# Patient Record
Sex: Female | Born: 1950 | Race: White | Hispanic: No | Marital: Married | Smoking: Never smoker
Health system: Southern US, Community
[De-identification: ages and names within clinical notes are randomized; demographics above are authoritative.]

## PROBLEM LIST (undated history)

## (undated) DIAGNOSIS — E559 Vitamin D deficiency, unspecified: Secondary | ICD-10-CM

## (undated) DIAGNOSIS — G2581 Restless legs syndrome: Secondary | ICD-10-CM

## (undated) DIAGNOSIS — G43909 Migraine, unspecified, not intractable, without status migrainosus: Secondary | ICD-10-CM

## (undated) DIAGNOSIS — I10 Essential (primary) hypertension: Secondary | ICD-10-CM

## (undated) DIAGNOSIS — M1990 Unspecified osteoarthritis, unspecified site: Secondary | ICD-10-CM

## (undated) DIAGNOSIS — D649 Anemia, unspecified: Secondary | ICD-10-CM

## (undated) DIAGNOSIS — K219 Gastro-esophageal reflux disease without esophagitis: Secondary | ICD-10-CM

## (undated) DIAGNOSIS — F419 Anxiety disorder, unspecified: Secondary | ICD-10-CM

## (undated) HISTORY — PX: APPENDECTOMY: SHX54

## (undated) HISTORY — PX: TUBAL LIGATION: SHX77

---

## 2004-10-05 ENCOUNTER — Ambulatory Visit: Payer: Self-pay | Admitting: Unknown Physician Specialty

## 2010-03-03 ENCOUNTER — Ambulatory Visit: Payer: Self-pay | Admitting: Specialist

## 2010-03-23 ENCOUNTER — Ambulatory Visit: Payer: Self-pay | Admitting: Anesthesiology

## 2010-04-30 ENCOUNTER — Ambulatory Visit: Payer: Self-pay | Admitting: Anesthesiology

## 2010-05-20 ENCOUNTER — Ambulatory Visit: Payer: Self-pay | Admitting: Anesthesiology

## 2010-06-25 ENCOUNTER — Ambulatory Visit: Payer: Self-pay | Admitting: Anesthesiology

## 2010-07-22 ENCOUNTER — Ambulatory Visit: Payer: Self-pay | Admitting: Anesthesiology

## 2010-08-19 ENCOUNTER — Ambulatory Visit: Payer: Self-pay | Admitting: Anesthesiology

## 2010-09-25 ENCOUNTER — Ambulatory Visit: Payer: Self-pay | Admitting: Anesthesiology

## 2010-11-02 ENCOUNTER — Ambulatory Visit: Payer: Self-pay | Admitting: Anesthesiology

## 2010-12-14 ENCOUNTER — Ambulatory Visit: Payer: Self-pay | Admitting: Anesthesiology

## 2011-02-09 ENCOUNTER — Ambulatory Visit: Payer: Self-pay | Admitting: Anesthesiology

## 2011-03-11 ENCOUNTER — Ambulatory Visit: Payer: Self-pay | Admitting: Anesthesiology

## 2012-07-27 ENCOUNTER — Ambulatory Visit: Payer: Self-pay | Admitting: General Practice

## 2013-01-11 ENCOUNTER — Other Ambulatory Visit: Payer: Self-pay | Admitting: Neurological Surgery

## 2013-02-06 ENCOUNTER — Encounter (HOSPITAL_COMMUNITY): Payer: Self-pay | Admitting: Respiratory Therapy

## 2013-02-13 ENCOUNTER — Encounter (HOSPITAL_COMMUNITY): Payer: Self-pay

## 2013-02-13 ENCOUNTER — Encounter (HOSPITAL_COMMUNITY)
Admission: RE | Admit: 2013-02-13 | Discharge: 2013-02-13 | Disposition: A | Discharge status: Home / Self Care | Payer: BC Managed Care – PPO | Source: Ambulatory Visit | Attending: Neurological Surgery | Admitting: Neurological Surgery

## 2013-02-13 ENCOUNTER — Encounter (HOSPITAL_COMMUNITY)
Admission: RE | Admit: 2013-02-13 | Discharge: 2013-02-13 | Disposition: A | Discharge status: Home / Self Care | Payer: BC Managed Care – PPO | Source: Ambulatory Visit | Attending: Anesthesiology | Admitting: Anesthesiology

## 2013-02-13 DIAGNOSIS — I10 Essential (primary) hypertension: Secondary | ICD-10-CM | POA: Insufficient documentation

## 2013-02-13 DIAGNOSIS — Z01818 Encounter for other preprocedural examination: Secondary | ICD-10-CM | POA: Insufficient documentation

## 2013-02-13 DIAGNOSIS — Z01812 Encounter for preprocedural laboratory examination: Secondary | ICD-10-CM | POA: Insufficient documentation

## 2013-02-13 DIAGNOSIS — Z01810 Encounter for preprocedural cardiovascular examination: Secondary | ICD-10-CM | POA: Insufficient documentation

## 2013-02-13 DIAGNOSIS — E119 Type 2 diabetes mellitus without complications: Secondary | ICD-10-CM | POA: Insufficient documentation

## 2013-02-13 HISTORY — DX: Vitamin D deficiency, unspecified: E55.9

## 2013-02-13 HISTORY — DX: Restless legs syndrome: G25.81

## 2013-02-13 HISTORY — DX: Unspecified osteoarthritis, unspecified site: M19.90

## 2013-02-13 HISTORY — DX: Gastro-esophageal reflux disease without esophagitis: K21.9

## 2013-02-13 HISTORY — DX: Migraine, unspecified, not intractable, without status migrainosus: G43.909

## 2013-02-13 HISTORY — DX: Essential (primary) hypertension: I10

## 2013-02-13 HISTORY — DX: Anxiety disorder, unspecified: F41.9

## 2013-02-13 LAB — CBC
MCH: 29.2 pg (ref 26.0–34.0)
Platelets: 211 10*3/uL (ref 150–400)
RBC: 4.62 MIL/uL (ref 3.87–5.11)
RDW: 13.8 % (ref 11.5–15.5)
WBC: 7.3 10*3/uL (ref 4.0–10.5)

## 2013-02-13 LAB — BASIC METABOLIC PANEL
Calcium: 9 mg/dL (ref 8.4–10.5)
Creatinine, Ser: 0.69 mg/dL (ref 0.50–1.10)
GFR calc non Af Amer: >90 mL/min (ref >90)
Glucose, Bld: 120 mg/dL — ABNORMAL HIGH (ref 70–99)
Sodium: 140 mEq/L (ref 135–145)

## 2013-02-13 LAB — SURGICAL PCR SCREEN: Staphylococcus aureus: POSITIVE — AB

## 2013-02-13 LAB — ABO/RH: ABO/RH(D): O POS

## 2013-02-13 LAB — TYPE AND SCREEN: ABO/RH(D): O POS

## 2013-02-13 NOTE — Progress Notes (Signed)
Requested EKG from [facility]. Patient reports that is where she had her last EKG

## 2013-02-13 NOTE — Progress Notes (Signed)
Revonda Standard, [place] notified of Critical reading on EKG

## 2013-02-13 NOTE — Progress Notes (Signed)
Anesthesia PAT Evaluation: Patient is a 62 year old female scheduled for L4-5 decompression, PLIF by Dr. Danielle Dess on 02/20/2013. History includes migraines, hypertension, restless leg syndrome, vitamin D deficiency, GERD, anxiety, nonsmoker.  Her PCP is Dr. Julien Girt at [facility].   Unconfirmed computerized EKG interpretation from today 02/13/13 showed NSR, possible LAE, non-specific T wave abnormality with critical test result of long QT/QTc of 546/572 ms.  Her T waves are relatively flat, but my own QT measurement was 440 ms.  Since it was critical report, I also had cardiologist Dr. Arvilla Meres review her EKG and history just after 1500.  He also did not agree with the computer's interpretation of critical prolonged QT.  He measured her QT interval at 400 ms. In the absence of other findings, he did not feel her EKG was worrisome.      She does not see a cardiologist, but was evaluated at [facility] > 10 years ago.  She denies chest pain, SOB, edema.  She has no known history of CAD or arrhythmias and no known family history of arrhythmias or sudden death.   Exam shows a pleasant, Caucasian female in NAD.  Heart RRR, I/VI SEM, lungs clear, no carotid bruit or LE edema noted. Her activity has been limited for the past two years due to back pain.  She is able to vacuum and go up stairs, but she does so with back pain.  She is on beta blocker therapy for HTN.  CXR on 02/13/13 showed no acute cardiopulmonary disease.  Preoperative labs noted.  She will be evaluated by her assigned anesthesiologist on the day of surgery, but if no acute changes then would anticipate she could proceed as planned.  Velna Ochs [facility]/Anesthesiology Phone [phone] 02/13/2013 5:00 PM

## 2013-02-13 NOTE — Pre-Procedure Instructions (Signed)
Amanda Brooks  02/13/2013   Your procedure is scheduled on:  April 8  Report to [facility] at 05:30 AM.  Call this number if you have problems the morning of surgery: [phone]   Remember:   Do not eat food or drink liquids after midnight.   Take these medicines the morning of surgery with A SIP OF WATER: Celexa, Omeprazole, Propranolol,    STOP Meloxicam and Vitamin D after today  Do not wear jewelry, make-up or nail polish.  Do not wear lotions, powders, or perfumes. You may wear deodorant.  Do not shave 48 hours prior to surgery. Men may shave face and neck.  Do not bring valuables to the hospital.  Contacts, dentures or bridgework may not be worn into surgery.  Leave suitcase in the car. After surgery it may be brought to your room.  For patients admitted to the hospital, checkout time is 11:00 AM the day of  discharge.   Special Instructions: Shower using CHG 2 nights before surgery and the night before surgery.  If you shower the day of surgery use CHG.  Use special wash - you have one bottle of CHG for all showers.  You should use approximately 1/3 of the bottle for each shower.   Please read over the following fact sheets that you were given: Pain Booklet, Coughing and Deep Breathing, Blood Transfusion Information and Surgical Site Infection Prevention

## 2013-02-22 MED ORDER — CEFAZOLIN SODIUM-DEXTROSE 2-3 GM-% IV SOLR
2.0000 g | INTRAVENOUS | Status: AC
  Administered 2013-02-23: 2 g via INTRAVENOUS
  Filled 2013-02-22: qty 50

## 2013-02-23 ENCOUNTER — Ambulatory Visit (HOSPITAL_COMMUNITY): Payer: BC Managed Care – PPO

## 2013-02-23 ENCOUNTER — Encounter (HOSPITAL_COMMUNITY): Payer: Self-pay | Admitting: Anesthesiology

## 2013-02-23 ENCOUNTER — Encounter (HOSPITAL_COMMUNITY): Payer: Self-pay | Admitting: Vascular Surgery

## 2013-02-23 ENCOUNTER — Encounter (HOSPITAL_COMMUNITY)
Admission: RE | Disposition: A | Discharge status: Home / Self Care | Payer: Self-pay | Source: Ambulatory Visit | Attending: Neurological Surgery

## 2013-02-23 ENCOUNTER — Ambulatory Visit (HOSPITAL_COMMUNITY): Payer: BC Managed Care – PPO | Admitting: Anesthesiology

## 2013-02-23 ENCOUNTER — Inpatient Hospital Stay (HOSPITAL_COMMUNITY)
Admission: RE | Admit: 2013-02-23 | Discharge: 2013-02-26 | DRG: 756 | Disposition: A | Discharge status: Home / Self Care | Payer: BC Managed Care – PPO | Source: Ambulatory Visit | Attending: Neurological Surgery | Admitting: Neurological Surgery

## 2013-02-23 DIAGNOSIS — Z01810 Encounter for preprocedural cardiovascular examination: Secondary | ICD-10-CM

## 2013-02-23 DIAGNOSIS — M48061 Spinal stenosis, lumbar region without neurogenic claudication: Principal | ICD-10-CM | POA: Diagnosis present

## 2013-02-23 DIAGNOSIS — G2581 Restless legs syndrome: Secondary | ICD-10-CM | POA: Diagnosis present

## 2013-02-23 DIAGNOSIS — Z79899 Other long term (current) drug therapy: Secondary | ICD-10-CM

## 2013-02-23 DIAGNOSIS — K219 Gastro-esophageal reflux disease without esophagitis: Secondary | ICD-10-CM | POA: Diagnosis present

## 2013-02-23 DIAGNOSIS — F411 Generalized anxiety disorder: Secondary | ICD-10-CM | POA: Diagnosis present

## 2013-02-23 DIAGNOSIS — Q762 Congenital spondylolisthesis: Secondary | ICD-10-CM

## 2013-02-23 DIAGNOSIS — Z01812 Encounter for preprocedural laboratory examination: Secondary | ICD-10-CM

## 2013-02-23 DIAGNOSIS — M4316 Spondylolisthesis, lumbar region: Secondary | ICD-10-CM

## 2013-02-23 DIAGNOSIS — Z01818 Encounter for other preprocedural examination: Secondary | ICD-10-CM

## 2013-02-23 DIAGNOSIS — I10 Essential (primary) hypertension: Secondary | ICD-10-CM | POA: Diagnosis present

## 2013-02-23 SURGERY — POSTERIOR LUMBAR FUSION 1 LEVEL
Anesthesia: General | Site: Back | Wound class: Clean

## 2013-02-23 MED ORDER — ONDANSETRON HCL 4 MG/2ML IJ SOLN: 4.0000 mg | INTRAMUSCULAR | Status: DC | PRN

## 2013-02-23 MED ORDER — 0.9 % SODIUM CHLORIDE (POUR BTL) OPTIME
TOPICAL | Status: DC | PRN
  Administered 2013-02-23: 1000 mL

## 2013-02-23 MED ORDER — GLYCOPYRROLATE 0.2 MG/ML IJ SOLN
INTRAMUSCULAR | Status: DC | PRN
  Administered 2013-02-23: 0.6 mg via INTRAVENOUS

## 2013-02-23 MED ORDER — KETOROLAC TROMETHAMINE 30 MG/ML IJ SOLN
INTRAMUSCULAR | Status: AC
  Administered 2013-02-23: 15 mg
  Filled 2013-02-23: qty 1

## 2013-02-23 MED ORDER — METHOCARBAMOL 100 MG/ML IJ SOLN
500.0000 mg | Freq: Four times a day (QID) | INTRAVENOUS | Status: DC | PRN
  Filled 2013-02-23: qty 5

## 2013-02-23 MED ORDER — VITAMIN D3 25 MCG (1000 UNIT) PO TABS
2000.0000 [IU] | ORAL_TABLET | Freq: Every day | ORAL | Status: DC
  Administered 2013-02-23 – 2013-02-26 (×4): 2000 [IU] via ORAL
  Filled 2013-02-23 (×5): qty 2

## 2013-02-23 MED ORDER — SODIUM CHLORIDE 0.9 % IR SOLN
Status: DC | PRN
  Administered 2013-02-23: 14:00:00

## 2013-02-23 MED ORDER — SODIUM CHLORIDE 0.9 % IV SOLN: 250.0000 mL | INTRAVENOUS | Status: DC

## 2013-02-23 MED ORDER — LACTATED RINGERS IV SOLN
INTRAVENOUS | Status: DC | PRN
  Administered 2013-02-23 (×2): via INTRAVENOUS

## 2013-02-23 MED ORDER — ARTIFICIAL TEARS OP OINT
TOPICAL_OINTMENT | OPHTHALMIC | Status: DC | PRN
  Administered 2013-02-23: 1 via OPHTHALMIC

## 2013-02-23 MED ORDER — METOCLOPRAMIDE HCL 5 MG/ML IJ SOLN
INTRAMUSCULAR | Status: DC | PRN
  Administered 2013-02-23: 10 mg via INTRAVENOUS

## 2013-02-23 MED ORDER — ACETAMINOPHEN 325 MG PO TABS
650.0000 mg | ORAL_TABLET | ORAL | Status: DC | PRN
  Administered 2013-02-24 (×2): 650 mg via ORAL
  Filled 2013-02-23 (×2): qty 2

## 2013-02-23 MED ORDER — MENTHOL 3 MG MT LOZG: 1.0000 | LOZENGE | OROMUCOSAL | Status: DC | PRN

## 2013-02-23 MED ORDER — METHOCARBAMOL 500 MG PO TABS
500.0000 mg | ORAL_TABLET | Freq: Four times a day (QID) | ORAL | Status: DC | PRN
  Administered 2013-02-24 – 2013-02-26 (×6): 500 mg via ORAL
  Filled 2013-02-23 (×6): qty 1

## 2013-02-23 MED ORDER — OXYCODONE-ACETAMINOPHEN 5-325 MG PO TABS
1.0000 | ORAL_TABLET | ORAL | Status: DC | PRN
  Filled 2013-02-23: qty 2

## 2013-02-23 MED ORDER — LIDOCAINE-EPINEPHRINE 1 %-1:100000 IJ SOLN
INTRAMUSCULAR | Status: DC | PRN
  Administered 2013-02-23: 20 mL

## 2013-02-23 MED ORDER — ONDANSETRON HCL 4 MG/2ML IJ SOLN
INTRAMUSCULAR | Status: DC | PRN
  Administered 2013-02-23: 4 mg via INTRAVENOUS

## 2013-02-23 MED ORDER — PHENOL 1.4 % MT LIQD: 1.0000 | OROMUCOSAL | Status: DC | PRN

## 2013-02-23 MED ORDER — NEOSTIGMINE METHYLSULFATE 1 MG/ML IJ SOLN
INTRAMUSCULAR | Status: DC | PRN
  Administered 2013-02-23: 4 mg via INTRAVENOUS

## 2013-02-23 MED ORDER — SODIUM CHLORIDE 0.9 % IV SOLN
INTRAVENOUS | Status: AC
  Filled 2013-02-23: qty 500

## 2013-02-23 MED ORDER — SODIUM CHLORIDE 0.9 % IJ SOLN
3.0000 mL | Freq: Two times a day (BID) | INTRAMUSCULAR | Status: DC
  Administered 2013-02-24 – 2013-02-26 (×4): 3 mL via INTRAVENOUS

## 2013-02-23 MED ORDER — ONDANSETRON HCL 4 MG/2ML IJ SOLN: 4.0000 mg | Freq: Once | INTRAMUSCULAR | Status: DC | PRN

## 2013-02-23 MED ORDER — PROPOFOL 10 MG/ML IV BOLUS
INTRAVENOUS | Status: DC | PRN
  Administered 2013-02-23: 200 mg via INTRAVENOUS

## 2013-02-23 MED ORDER — MORPHINE SULFATE 2 MG/ML IJ SOLN
1.0000 mg | INTRAMUSCULAR | Status: DC | PRN
  Administered 2013-02-23: 2 mg via INTRAVENOUS
  Administered 2013-02-24 (×2): 4 mg via INTRAVENOUS
  Administered 2013-02-24: 2 mg via INTRAVENOUS
  Administered 2013-02-24: 4 mg via INTRAVENOUS
  Administered 2013-02-25: 2 mg via INTRAVENOUS
  Filled 2013-02-23: qty 2
  Filled 2013-02-23 (×3): qty 1
  Filled 2013-02-23 (×3): qty 2

## 2013-02-23 MED ORDER — FLEET ENEMA 7-19 GM/118ML RE ENEM: 1.0000 | ENEMA | Freq: Once | RECTAL | Status: AC | PRN

## 2013-02-23 MED ORDER — CEFAZOLIN SODIUM 1-5 GM-% IV SOLN
1.0000 g | Freq: Three times a day (TID) | INTRAVENOUS | Status: AC
  Administered 2013-02-23 – 2013-02-24 (×2): 1 g via INTRAVENOUS
  Filled 2013-02-23 (×2): qty 50

## 2013-02-23 MED ORDER — BISACODYL 10 MG RE SUPP: 10.0000 mg | Freq: Every day | RECTAL | Status: DC | PRN

## 2013-02-23 MED ORDER — ROCURONIUM BROMIDE 100 MG/10ML IV SOLN: INTRAVENOUS | Status: DC | PRN

## 2013-02-23 MED ORDER — BUPIVACAINE HCL (PF) 0.5 % IJ SOLN
INTRAMUSCULAR | Status: DC | PRN
  Administered 2013-02-23: 30 mL

## 2013-02-23 MED ORDER — HYDROMORPHONE HCL PF 1 MG/ML IJ SOLN
0.2500 mg | INTRAMUSCULAR | Status: DC | PRN
  Administered 2013-02-23 (×2): 0.5 mg via INTRAVENOUS

## 2013-02-23 MED ORDER — PROPRANOLOL HCL ER 160 MG PO CP24
160.0000 mg | ORAL_CAPSULE | Freq: Every day | ORAL | Status: DC
  Administered 2013-02-26: 160 mg via ORAL
  Filled 2013-02-23 (×3): qty 1

## 2013-02-23 MED ORDER — DOCUSATE SODIUM 100 MG PO CAPS
100.0000 mg | ORAL_CAPSULE | Freq: Two times a day (BID) | ORAL | Status: DC
  Administered 2013-02-23 – 2013-02-26 (×6): 100 mg via ORAL
  Filled 2013-02-23 (×6): qty 1

## 2013-02-23 MED ORDER — SODIUM CHLORIDE 0.9 % IJ SOLN: 3.0000 mL | INTRAMUSCULAR | Status: DC | PRN

## 2013-02-23 MED ORDER — DEXTROSE 5 % IV SOLN
INTRAVENOUS | Status: DC | PRN
  Administered 2013-02-23: 14:00:00 via INTRAVENOUS

## 2013-02-23 MED ORDER — POLYETHYLENE GLYCOL 3350 17 G PO PACK
17.0000 g | PACK | Freq: Every day | ORAL | Status: DC | PRN
  Filled 2013-02-23: qty 1

## 2013-02-23 MED ORDER — EPHEDRINE SULFATE 50 MG/ML IJ SOLN
INTRAMUSCULAR | Status: DC | PRN
  Administered 2013-02-23: 10 mg via INTRAVENOUS

## 2013-02-23 MED ORDER — ROCURONIUM BROMIDE 100 MG/10ML IV SOLN
INTRAVENOUS | Status: DC | PRN
  Administered 2013-02-23: 35 mg via INTRAVENOUS

## 2013-02-23 MED ORDER — KETOROLAC TROMETHAMINE 15 MG/ML IJ SOLN
15.0000 mg | Freq: Three times a day (TID) | INTRAMUSCULAR | Status: AC
  Administered 2013-02-23 – 2013-02-25 (×5): 15 mg via INTRAVENOUS
  Filled 2013-02-23 (×5): qty 1

## 2013-02-23 MED ORDER — ALUM & MAG HYDROXIDE-SIMETH 200-200-20 MG/5ML PO SUSP: 30.0000 mL | Freq: Four times a day (QID) | ORAL | Status: DC | PRN

## 2013-02-23 MED ORDER — SUMATRIPTAN SUCCINATE 100 MG PO TABS
100.0000 mg | ORAL_TABLET | ORAL | Status: DC | PRN
  Filled 2013-02-23: qty 1

## 2013-02-23 MED ORDER — VITAMIN D3 50 MCG (2000 UT) PO TABS
2000.0000 [IU] | ORAL_TABLET | Freq: Every day | ORAL | Status: DC
  Filled 2013-02-23: qty 1

## 2013-02-23 MED ORDER — ROPINIROLE HCL 1 MG PO TABS
2.0000 mg | ORAL_TABLET | Freq: Every day | ORAL | Status: DC
  Administered 2013-02-23 – 2013-02-25 (×3): 2 mg via ORAL
  Filled 2013-02-23 (×5): qty 2

## 2013-02-23 MED ORDER — SODIUM CHLORIDE 0.9 % IV SOLN
INTRAVENOUS | Status: DC
  Administered 2013-02-23 – 2013-02-24 (×3): via INTRAVENOUS

## 2013-02-23 MED ORDER — BACITRACIN 50000 UNITS IM SOLR
INTRAMUSCULAR | Status: AC
  Filled 2013-02-23: qty 1

## 2013-02-23 MED ORDER — FENTANYL CITRATE 0.05 MG/ML IJ SOLN
INTRAMUSCULAR | Status: DC | PRN
  Administered 2013-02-23: 150 ug via INTRAVENOUS
  Administered 2013-02-23 (×2): 50 ug via INTRAVENOUS
  Administered 2013-02-23: 100 ug via INTRAVENOUS
  Administered 2013-02-23 (×3): 50 ug via INTRAVENOUS

## 2013-02-23 MED ORDER — ACETAMINOPHEN 650 MG RE SUPP: 650.0000 mg | RECTAL | Status: DC | PRN

## 2013-02-23 MED ORDER — HYDROMORPHONE HCL PF 1 MG/ML IJ SOLN
INTRAMUSCULAR | Status: AC
  Filled 2013-02-23: qty 1

## 2013-02-23 MED ORDER — LIDOCAINE HCL (CARDIAC) 20 MG/ML IV SOLN
INTRAVENOUS | Status: DC | PRN
  Administered 2013-02-23: 50 mg via INTRAVENOUS

## 2013-02-23 MED ORDER — CITALOPRAM HYDROBROMIDE 40 MG PO TABS
40.0000 mg | ORAL_TABLET | Freq: Every day | ORAL | Status: DC
  Administered 2013-02-24 – 2013-02-26 (×3): 40 mg via ORAL
  Filled 2013-02-23 (×3): qty 1

## 2013-02-23 MED ORDER — SENNA 8.6 MG PO TABS
1.0000 | ORAL_TABLET | Freq: Two times a day (BID) | ORAL | Status: DC
  Administered 2013-02-23 – 2013-02-26 (×6): 8.6 mg via ORAL
  Filled 2013-02-23 (×7): qty 1

## 2013-02-23 MED ORDER — PANTOPRAZOLE SODIUM 40 MG PO TBEC
40.0000 mg | DELAYED_RELEASE_TABLET | Freq: Every day | ORAL | Status: DC
  Administered 2013-02-24 – 2013-02-26 (×3): 40 mg via ORAL
  Filled 2013-02-23 (×2): qty 1

## 2013-02-23 MED ORDER — THROMBIN 20000 UNITS EX KIT
PACK | CUTANEOUS | Status: DC | PRN
  Administered 2013-02-23: 20000 [IU] via TOPICAL

## 2013-02-23 MED ORDER — ALBUMIN HUMAN 5 % IV SOLN
INTRAVENOUS | Status: DC | PRN
  Administered 2013-02-23: 15:00:00 via INTRAVENOUS

## 2013-02-23 MED ORDER — PHENYLEPHRINE HCL 10 MG/ML IJ SOLN
INTRAMUSCULAR | Status: DC | PRN
  Administered 2013-02-23: 40 ug via INTRAVENOUS
  Administered 2013-02-23: 80 ug via INTRAVENOUS

## 2013-02-23 MED ORDER — HEMOSTATIC AGENTS (NO CHARGE) OPTIME
TOPICAL | Status: DC | PRN
  Administered 2013-02-23: 1 via TOPICAL

## 2013-02-23 SURGICAL SUPPLY — 61 items
BAG DECANTER FOR FLEXI CONT (MISCELLANEOUS) ×2 IMPLANT
BLADE SURG ROTATE 9660 (MISCELLANEOUS) IMPLANT
BUR MATCHSTICK NEURO 3.0 LAGG (BURR) ×2 IMPLANT
CANISTER SUCTION 2500CC (MISCELLANEOUS) ×2 IMPLANT
CLOTH BEACON ORANGE TIMEOUT ST (SAFETY) ×2 IMPLANT
CONT SPEC 4OZ CLIKSEAL STRL BL (MISCELLANEOUS) ×4 IMPLANT
COVER BACK TABLE 24X17X13 BIG (DRAPES) IMPLANT
COVER TABLE BACK 60X90 (DRAPES) ×2 IMPLANT
DECANTER SPIKE VIAL GLASS SM (MISCELLANEOUS) ×2 IMPLANT
DERMABOND ADHESIVE PROPEN (GAUZE/BANDAGES/DRESSINGS) ×1
DERMABOND ADVANCED (GAUZE/BANDAGES/DRESSINGS) ×1
DERMABOND ADVANCED .7 DNX12 (GAUZE/BANDAGES/DRESSINGS) ×1 IMPLANT
DERMABOND ADVANCED .7 DNX6 (GAUZE/BANDAGES/DRESSINGS) ×1 IMPLANT
DRAPE C-ARM 42X72 X-RAY (DRAPES) ×4 IMPLANT
DRAPE LAPAROTOMY 100X72X124 (DRAPES) ×2 IMPLANT
DRAPE POUCH INSTRU U-SHP 10X18 (DRAPES) ×2 IMPLANT
DRAPE PROXIMA HALF (DRAPES) IMPLANT
DURAPREP 26ML APPLICATOR (WOUND CARE) ×2 IMPLANT
ELECT REM PT RETURN 9FT ADLT (ELECTROSURGICAL) ×2
ELECTRODE REM PT RTRN 9FT ADLT (ELECTROSURGICAL) ×1 IMPLANT
GAUZE SPONGE 4X4 16PLY XRAY LF (GAUZE/BANDAGES/DRESSINGS) IMPLANT
GLOVE BIOGEL PI IND STRL 7.0 (GLOVE) ×3 IMPLANT
GLOVE BIOGEL PI IND STRL 8.5 (GLOVE) ×2 IMPLANT
GLOVE BIOGEL PI INDICATOR 7.0 (GLOVE) ×3
GLOVE BIOGEL PI INDICATOR 8.5 (GLOVE) ×2
GLOVE ECLIPSE 8.5 STRL (GLOVE) ×4 IMPLANT
GLOVE EXAM NITRILE LRG STRL (GLOVE) IMPLANT
GLOVE EXAM NITRILE MD LF STRL (GLOVE) IMPLANT
GLOVE EXAM NITRILE XL STR (GLOVE) ×2 IMPLANT
GLOVE EXAM NITRILE XS STR PU (GLOVE) IMPLANT
GLOVE INDICATOR 7.5 STRL GRN (GLOVE) ×2 IMPLANT
GOWN BRE IMP SLV AUR LG STRL (GOWN DISPOSABLE) IMPLANT
GOWN BRE IMP SLV AUR XL STRL (GOWN DISPOSABLE) IMPLANT
GOWN STRL REIN 2XL LVL4 (GOWN DISPOSABLE) ×4 IMPLANT
KIT BASIN OR (CUSTOM PROCEDURE TRAY) ×2 IMPLANT
KIT ROOM TURNOVER OR (KITS) ×2 IMPLANT
MILL MEDIUM DISP (BLADE) ×2 IMPLANT
NEEDLE HYPO 22GX1.5 SAFETY (NEEDLE) ×2 IMPLANT
NS IRRIG 1000ML POUR BTL (IV SOLUTION) ×2 IMPLANT
PACK FOAM VITOSS 10CC (Orthopedic Implant) ×2 IMPLANT
PACK LAMINECTOMY NEURO (CUSTOM PROCEDURE TRAY) ×2 IMPLANT
PAD ARMBOARD 7.5X6 YLW CONV (MISCELLANEOUS) ×6 IMPLANT
PATTIES SURGICAL .5 X1 (DISPOSABLE) ×2 IMPLANT
PEEK PLIF NOVEL 9X25X12 (Peek) ×4 IMPLANT
ROD 45.5X40CM (Rod) ×4 IMPLANT
SCREW 40MM (Screw) ×8 IMPLANT
SCREW SET SPINAL STD HEXALOBE (Screw) ×8 IMPLANT
SPONGE GAUZE 4X4 12PLY (GAUZE/BANDAGES/DRESSINGS) ×2 IMPLANT
SPONGE LAP 4X18 X RAY DECT (DISPOSABLE) IMPLANT
SPONGE SURGIFOAM ABS GEL 100 (HEMOSTASIS) ×2 IMPLANT
SUT VIC AB 1 CT1 18XBRD ANBCTR (SUTURE) ×1 IMPLANT
SUT VIC AB 1 CT1 8-18 (SUTURE) ×1
SUT VIC AB 2-0 CP2 18 (SUTURE) ×2 IMPLANT
SUT VIC AB 3-0 SH 8-18 (SUTURE) ×2 IMPLANT
SYR 20ML ECCENTRIC (SYRINGE) ×2 IMPLANT
TAPE CLOTH SURG 4X10 WHT LF (GAUZE/BANDAGES/DRESSINGS) ×2 IMPLANT
TOWEL OR 17X24 6PK STRL BLUE (TOWEL DISPOSABLE) ×2 IMPLANT
TOWEL OR 17X26 10 PK STRL BLUE (TOWEL DISPOSABLE) ×2 IMPLANT
TRAP SPECIMEN MUCOUS 40CC (MISCELLANEOUS) ×2 IMPLANT
TRAY FOLEY CATH 14FRSI W/METER (CATHETERS) ×2 IMPLANT
WATER STERILE IRR 1000ML POUR (IV SOLUTION) ×2 IMPLANT

## 2013-02-23 NOTE — H&P (Signed)
CHIEF COMPLAINT:    Back and leg pain.  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 She tells me that for over two year's time she has had progressively worsening back and leg pain. She describes the worse pain in her buttock and hip on the right side with radiation down into the lower extremities.  She finds that progressively her ability to walk and the strength in her legs has been deteriorating.  She tells me now that she has a hard time walking onto her toes at all and she feels that her legs are weakening.  She has been seen and treated initially by her primary care physician and she encloses a time line starting around August of 2010 when she saw her family doctor who felt her problem was musculature and sent her for four months of physical therapy which did not yield any significant relief.  In March of 2011, she found Dr. Reita Chard, an orthopedist in [place] who did x-rays of the hip and wanted to do an MRI but suggested that she be treated at the pain clinic.  She subsequently saw Dr. Henrene Hawking in May of 2011 through April 2012 and had a series of three epidural steroid injections followed by three facet blocks followed by radiofrequency procedure followed by further epidural steroid injections which gave limited if any relief of duration.  Then in September 2012 she started seeing Dr. Renea Ee, chiropractor through November of 2012.  In April of this past year, 2013, she saw Dr. Artis Flock, an orthopedist at [facility] and tried some steroids and strengthening exercises with limited relief. In September 2013, an MRI was performed and then she was seen by Dr. Yves Dill a physiatrist at [facility].  Several medications were tried and she has since that time been referred for this evaluation.    PAST MEDICAL HISTORY:   She has had hypertension.    CURRENT MEDICATIONS:   Propranolol 160  mg. q.d. for blood pressure, Meloxicam 15 mg. q.d. for arthritis, Citalopram 40 mg. q.d. for anxiety, Ropinirole 2 mg. q.d. for restless legs, Omeprazole 20 mg. q.d. for heartburn, Vitamin D1 and Sumatriptan 100 mg. as needed for migraines    ALLERGIES:     She notes no allergies to medications.   SOCIAL HISTORY:    She does not smoke or use alcohol.  Height and weight have been stable at 5'5" tall and 145 pounds.    FAMILY HISTORY:    Notable for some hypertension.  Parents are deceased. Mother of cancer and father of heart disease.    REVIEW OF SYSTEMS:   Notable for wearing of glasses, sinus problems, high blood pressure, leg pain while walking, leg weakness, back pain, leg pain, arthritis and neck pain and anxiety as noted on the 14-point review sheet in the office today.   PHYSICAL EXAMINATION:   On physical exam, I note she stands straight and erect without difficulty. She is able to flex forward to +80 degrees easily touching her fingertips to the floor. She hyperextends with difficulty and notes that this exacerbates things.  Palpation and percussion across her lumbar spine does not reproduce any symptoms.  On testing her heel and toe walking, she does have trouble balancing onto her toes in either lower extremity and on heel walking there is weakness noted more of the extensor hallucis longus on the left side compared to the right side.  Her  reflexes are 1+ and symmetric in the patella and trace in the Achilles.     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  I demonstrated the findings to the patient and her daughter.  I indicated that overall her spine appears quite healthy in every other way save for the lesion at L4-5.  I noted that this lesion is actually remedial to surgery.  I described to her a decompression by removing the bone on the posterior arch, removing the disc completely at L4-5, placing spacers  into the disc space to realign the vertebra and then placing bone graft into the disc space and around it and placing screws into the vertebrae to hold the vertebra together to allow this to heal as a single unit.  This is indeed a substantial operation.  However the fact that she has single level disease with a high grade stenosis would indicate to me that she has the best chance of healing this process.  Overall I believe she should do well.  I indicated that the surgery takes about 3 hours itself to do.  Most individuals are in the hospital for about 3-4 days time.  An external corset is worn for about 6-8 weeks and then it takes an additional 6-8 weeks to heal fully to where a person can function completely independently.  Back pain to some degree is usually present to some degree for up to 6-8 months.  However, this should be in a diminishing fashion.    The patient has been through an extensive effort at conservative management and having failed this and having evolving weakness in her lower extremity, I think that ultimately she needs to have the surgery performed.  She is admitted for surgery today.

## 2013-02-23 NOTE — Op Note (Signed)
Procedure: L4-L5 decompressive laminectomy decompression of L4 and L5 nerve roots, posterior lumbar interbody arthrodesis with peek spacers local autograft and allograft, pedicle screw fixation L4-L5, posterior lateral arthrodesis L4-L5  Surgeon: Barnett Abu M.D.  Asst.: Hilda Lias  Indications: Patient is Amanda Brooks is a 62 y.o. female who who's had significant back pain and lumbar radiculopathy for over a years period time. A lumbar MRI demonstrates advanced spondylolisthesis with high-grade canal stenosis. she was advised regarding surgical intervention. There is stenosis also for the L4 and L5 nerve roots bilaterally  Procedure: The patient was brought to the operating room supine on a stretcher. After the smooth induction of general endotracheal anesthesia she was turned prone and the back was prepped with alcohol and DuraPrep. The back was then draped sterilely. A midline incision was created and carried down to the lumbar dorsal fascia. A localizing radiograph identified the L4 and L5 spinous processes. A subligamentous dissection was created at L4 and L5 to expose the interlaminar space at L4 and L5 and the facet joints over the L4-L5 interspace. Laminotomies were were then created removing the entire inferior margin of the lamina of L4 including the inferior facet at the L4-L5 joint. The yellow ligament was taken up and the common dural tube was exposed along with the L4 nerve root superiorly, and the L5 nerve root inferiorly, the disc space was exposed and epidural veins in this region were cauterized and divided. The L4 nerve roots and the L5 nerve root were dissected with care taken to protect them. There is severe stenosis for the L4 nerve roots caused by primarily hypertrophy of the superior articular process of L5. This was dissected and the nerve roots were freed. The L5 nerve roots were noted to be severely stenotic and there entry into the foramen for L5 inferiorly and this was  similarly decompressed using a high-speed drill and a combination of curettes and rongeurs. The disc space was opened and a combination of curettes and rongeurs was used to evacuate the disc space fully. The endplates were removed using sharp curettes. An interbody spacer was placed to distract the disc space while the contralateral discectomy was performed. When the entirety of the disc was removed and the endplates were prepared final sizing of the disc space was obtained 12 mm peek spacers were chosen and packed with autograft and allograft and placed into the interspace. The remainder of the interspace was packed with autograft and allograft. Pedicle entry sites were then chosen using fluoroscopic guidance and 6.5 x 40 mm screws were placed in L4 and 6.5 x 40 mm screws were placed in L5. The lateral gutters were decorticated and graft was packed in the posterolateral gutters between L4 and L5. Final radiographs were obtained after placing appropriately sized rods between the pedicle screws at L4-L5 and torquing these to the appropriate tension. The surgical site was inspected carefully to assure the L4 and L5 nerve roots were well decompressed, hemostasis was obtained, and the graft was well packed. Then the retractors were removed and the wound was closed with #1 Vicryl in the lumbar dorsal fascia 2-0 Vicryl in the subcutaneous tissue and 3-0 Vicryl subcuticularly. When the fascia was closed 20 cc of half percent Marcaine was injected into the paraspinous musculature at the time of closure. Blood loss was estimated at 150 cc. The patient tolerated procedure well and was returned to the recovery room in stable condition. Procedure and was done with Dr. Jeral Fruit who at times would dissect the  disc space provide retraction of the common dural tube and also provide exposure by suctioning in the epidural space as needed.

## 2013-02-23 NOTE — Anesthesia Preprocedure Evaluation (Addendum)
Anesthesia Evaluation  Patient identified by MRN, date of birth, ID band Patient awake    Reviewed: Allergy & Precautions, H&P , NPO status , Patient's Chart, lab work & pertinent test results, reviewed documented beta blocker date and time   Airway Mallampati: III TM Distance: >3 FB Neck ROM: full  Mouth opening: Limited Mouth Opening  Dental  (+) Teeth Intact and Dental Advisory Given   Pulmonary          Cardiovascular hypertension, Pt. on home beta blockers Rhythm:regular Rate:Normal     Neuro/Psych  Headaches, Anxiety    GI/Hepatic GERD-  ,  Endo/Other    Renal/GU      Musculoskeletal   Abdominal   Peds  Hematology   Anesthesia Other Findings   Reproductive/Obstetrics                         Anesthesia Physical Anesthesia Plan  ASA: II  Anesthesia Plan: General   Post-op Pain Management:    Induction: Intravenous  Airway Management Planned: Oral ETT  Additional Equipment:   Intra-op Plan:   Post-operative Plan: Extubation in OR  Informed Consent: I have reviewed the patients History and Physical, chart, labs and discussed the procedure including the risks, benefits and alternatives for the proposed anesthesia with the patient or authorized representative who has indicated his/her understanding and acceptance.     Plan Discussed with: CRNA, Anesthesiologist and Surgeon  Anesthesia Plan Comments:         Anesthesia Quick Evaluation

## 2013-02-23 NOTE — Preoperative (Signed)
Beta Blockers   Reason not to administer Beta Blockers:Inderal 0700 today

## 2013-02-23 NOTE — Anesthesia Procedure Notes (Signed)
Procedure Name: Intubation Date/Time: 02/23/2013 1:33 PM Performed by: Darcey Nora B Pre-anesthesia Checklist: Patient identified, Emergency Drugs available, Suction available and Patient being monitored Patient Re-evaluated:Patient Re-evaluated prior to inductionOxygen Delivery Method: Circle system utilized Preoxygenation: Pre-oxygenation with 100% oxygen Intubation Type: IV induction Ventilation: Mask ventilation without difficulty Laryngoscope Size: Mac and 3 Grade View: Grade I Tube type: Oral Tube size: 7.5 mm Number of attempts: 1 Airway Equipment and Method: Stylet Placement Confirmation: ETT inserted through vocal cords under direct vision,  breath sounds checked- equal and bilateral and positive ETCO2 Secured at: 21 (cm at teeth) cm Tube secured with: Tape Dental Injury: Teeth and Oropharynx as per pre-operative assessment

## 2013-02-23 NOTE — Transfer of Care (Signed)
Immediate Anesthesia Transfer of Care Note  Patient: Amanda Brooks  Procedure(s) Performed: Procedure(s) with comments: POSTERIOR LUMBAR FUSION 1 LEVEL (N/A) - Lumbar four - five  Decompression/posterior lumbar interbody fusion/Peek/Pedicle screws  Patient Location: PACU  Anesthesia Type:General  Level of Consciousness: oriented, sedated, patient cooperative and responds to stimulation  Airway & Oxygen Therapy: Patient Spontanous Breathing and Patient connected to nasal cannula oxygen  Post-op Assessment: Report given to PACU RN, Post -op Vital signs reviewed and stable, Patient moving all extremities and Patient moving all extremities X 4  Post vital signs: Reviewed and stable  Complications: No apparent anesthesia complications

## 2013-02-23 NOTE — Anesthesia Postprocedure Evaluation (Signed)
Anesthesia Post Note  Patient: Amanda Brooks  Procedure(s) Performed: Procedure(s) (LRB): POSTERIOR LUMBAR FUSION 1 LEVEL (N/A)  Anesthesia type: General  Patient location: PACU  Post pain: Pain level controlled  Post assessment: Patient's Cardiovascular Status Stable  Last Vitals:  Filed Vitals:   02/23/13 1715  BP:   Pulse: 58  Temp:   Resp: 12    Post vital signs: Reviewed and stable  Level of consciousness: alert  Complications: No apparent anesthesia complications

## 2013-02-24 MED ORDER — HYDROCODONE-ACETAMINOPHEN 5-325 MG PO TABS
1.0000 | ORAL_TABLET | ORAL | Status: DC | PRN
  Administered 2013-02-24: 1 via ORAL
  Administered 2013-02-24 – 2013-02-26 (×9): 2 via ORAL
  Filled 2013-02-24 (×3): qty 2
  Filled 2013-02-24: qty 1
  Filled 2013-02-24 (×6): qty 2

## 2013-02-24 NOTE — Progress Notes (Signed)
Subjective: Patient reports Back pain postop yesterday  Objective: Vital signs in last 24 hours: Temp:  [97 F (36.1 C)-100.7 F (38.2 C)] 100.7 F (38.2 C) (04/12 1023) Pulse Rate:  [58-93] 89 (04/12 1023) Resp:  [12-23] 20 (04/12 1023) BP: (91-136)/(53-71) 91/53 mmHg (04/12 1023) SpO2:  [91 %-100 %] 100 % (04/12 1023) FiO2 (%):  [2 %] 2 % (04/11 1756) Weight:  [68.04 kg (150 lb)] 68.04 kg (150 lb) (04/11 1804)  Intake/Output from previous day: 04/11 0701 - 04/12 0700 In: 2900 [I.V.:2650; IV Piggyback:250] Out: 2650 [Urine:2500; Blood:150] Intake/Output this shift:    Dressing is clean and dry motor function appears intact in both lower extremities.  Lab Results: No results found for this basename: WBC, HGB, HCT, PLT,  in the last 72 hours BMET No results found for this basename: NA, K, CL, CO2, GLUCOSE, BUN, CREATININE, CALCIUM,  in the last 72 hours  Studies/Results: Dg Lumbar Spine 2-3 Views  02/23/2013  *RADIOLOGY REPORT*  Clinical Data: L4-5 PLIF  LUMBAR SPINE - 2-3 VIEW  Comparison: Same day  Findings: Two C-arm images show discectomy and fusion at L4-5. Interbody fusion material appears well positioned.  There are bilateral pedicle screws and posterior rods.  IMPRESSION: Good appearance following L4-5 PLIF.   Original Report Authenticated By: Paulina Fusi, M.D.    Dg Lumbar Spine 1 View  02/23/2013  *RADIOLOGY REPORT*  Clinical Data: L4-5 fusion  LUMBAR SPINE - 1 VIEW  Comparison: None.  Findings: Surgical instruments project over the L4 and L5 spinous processes.  5 mm anterolisthesis L4 upon L5.  No compression deformity.  IMPRESSION: Intraoperative localization at L4-5.   Original Report Authenticated By: Jolaine Click, M.D.     Assessment/Plan: Stable postop day 1  LOS: 1 day  Mobilizing hep well IVs.   [name] 02/24/2013, 12:05 PM

## 2013-02-24 NOTE — Progress Notes (Signed)
Asked patient if she would get OOB with me and ambulate to the chair, patient said she was in too much pain but would try later after some pain medicine. Morphine given, will reattempt ambulation later on, and will remove foley at that time.  Minor, Yvette Rack

## 2013-02-24 NOTE — Progress Notes (Signed)
Pt with oral temp 100.7 per tech, tylenol given by RN, instructed about incentive spirometer, teaching completed. Pt states understanding to use IS x10 each hour that awake  Minor, Morrie Sheldon Horst Ostermiller

## 2013-02-24 NOTE — Progress Notes (Signed)
Occupational Therapy Evaluation Patient Details Name: Amanda Brooks MRN: [id number] DOB: 1951/06/25 Today's Date: 02/24/2013 Time: 1478-2956 OT Time Calculation (min): 14 min  OT Assessment / Plan / Recommendation Clinical Impression  62 yo s/p L4-5 PLIF. PTA, pt independent with all ADL and mobility for ADL. Pt will benefit from skilled OT services to facilitate D/C to home with 24/7 S of husband due to below deficits. Pt will benefit from education regarding AE for LB ADL/toileting and DME.    OT Assessment  Patient needs continued OT Services    Follow Up Recommendations  No OT follow up    Barriers to Discharge None    Equipment Recommendations  3 in 1 bedside comode    Recommendations for Other Services  none  Frequency  Min 2X/week    Precautions / Restrictions Precautions Precautions: Back Precaution Booklet Issued: Yes (comment) Precaution Comments: pt able to recall 3/3 precautions from earlier PT session Required Braces or Orthoses: Spinal Brace Spinal Brace: Applied in sitting position;Lumbar corset   Pertinent Vitals/Pain 4. Back. repositioned    ADL  Grooming: Supervision/safety Where Assessed - Grooming: Unsupported standing Upper Body Bathing: Set up;Supervision/safety Where Assessed - Upper Body Bathing: Unsupported sitting Lower Body Bathing: Moderate assistance Where Assessed - Lower Body Bathing: Supported sit to stand Upper Body Dressing: Supervision/safety;Set up Where Assessed - Upper Body Dressing: Unsupported sitting Lower Body Dressing: Moderate assistance Where Assessed - Lower Body Dressing: Supported sit to Pharmacist, hospital: Hydrographic surveyor Method: Sit to Barista: Comfort height toilet Toileting - Architect and Hygiene: Moderate assistance Where Assessed - Toileting Clothing Manipulation and Hygiene: Sit to stand from 3-in-1 or toilet Tub/Shower Transfer: Minimal assistance Tub/Shower  Transfer Method: Ambulating Transfers/Ambulation Related to ADLs: minguard ADL Comments: Began education on cross leg technique to adhere to back precautions during ADL. Rec pt purchase reacher and long handled sponge. discussed AE for hygiene after toileting.    OT Diagnosis: Generalized weakness;Acute pain  OT Problem List: Decreased strength;Decreased activity tolerance;Decreased knowledge of use of DME or AE;Decreased knowledge of precautions;Pain OT Treatment Interventions: Self-care/ADL training;Energy conservation;DME and/or AE instruction;Therapeutic activities;Patient/family education   OT Goals Acute Rehab OT Goals OT Goal Formulation: With patient Time For Goal Achievement: 03/03/13 Potential to Achieve Goals: Good ADL Goals Pt Will Perform Lower Body Bathing: with supervision;with caregiver independent in assisting;Sit to stand from chair;Unsupported;with adaptive equipment ADL Goal: Lower Body Bathing - Progress: Goal set today Pt Will Perform Lower Body Dressing: with supervision;with caregiver independent in assisting;Unsupported;with adaptive equipment ADL Goal: Lower Body Dressing - Progress: Goal set today Pt Will Transfer to Toilet: with modified independence;Ambulation;with DME;3-in-1 ADL Goal: Toilet Transfer - Progress: Goal set today Pt Will Perform Toileting - Clothing Manipulation: with modified independence;Standing;Sitting on 3-in-1 or toilet;with adaptive equipment ADL Goal: Toileting - Clothing Manipulation - Progress: Goal set today Additional ADL Goal #1: Pt will independently verbalize 3/3 back precautions ADL Goal: Additional Goal #1 - Progress: Goal set today  Visit Information  Last OT Received On: 02/24/13 Assistance Needed: +1    Subjective Data      Prior Functioning     Home Living Lives With: Spouse Available Help at Discharge: Available 24 hours/day Type of Home: House Home Access: Stairs to enter [employer] of Steps:  5 Entrance Stairs-Rails: Right Home Layout: One level Bathroom Shower/Tub: Forensic scientist: Standard Bathroom Accessibility: Yes How Accessible: Accessible via walker Home Adaptive Equipment: Walker - rolling Prior Function Level of Independence:  Independent Able to Take Stairs?: Yes Driving: Yes Vocation: Full time employment Communication Communication: No difficulties Dominant Hand: Right         Vision/Perception Vision - History Baseline Vision: Wears glasses all the time   Cognition  Cognition Overall Cognitive Status: Appears within functional limits for tasks assessed/performed Arousal/Alertness: Awake/alert Orientation Level: Appears intact for tasks assessed Behavior During Session: [facility] for tasks performed    Extremity/Trunk Assessment Right Upper Extremity Assessment RUE ROM/Strength/Tone: [facility] for tasks assessed Left Upper Extremity Assessment LUE ROM/Strength/Tone: [facility] for tasks assessed Right Lower Extremity Assessment RLE ROM/Strength/Tone: [facility] for tasks assessed Left Lower Extremity Assessment LLE ROM/Strength/Tone: [facility] for tasks assessed Trunk Assessment Trunk Assessment: Normal     Mobility Bed Mobility Bed Mobility: Not assessed Details for Bed Mobility Assistance: discussed log rolling technique Transfers Transfers: Stand to Sit;Sit to Stand Sit to Stand: 4: Min guard Stand to Sit: 4: Min guard     Exercise     Balance Balance Balance Assessed:  (WFL for ADL)   End of Session OT - End of Session Equipment Utilized During Treatment: Gait belt;Back brace Activity Tolerance: Patient tolerated treatment well Patient left: in chair;with call bell/phone within reach Nurse Communication: Mobility status;Precautions  GO Functional Assessment Tool Used: clinical judgement Functional Limitation: Self care Self Care Current Status (Y7829): At least 20 percent but less than 40 percent impaired, limited or  restricted Self Care Goal Status (F6213): At least 1 percent but less than 20 percent impaired, limited or restricted   [facility] 02/24/2013, 12:45 PM [facility], OTR/L  [phone] 02/24/2013

## 2013-02-24 NOTE — Progress Notes (Addendum)
Patient ambulated OOB to chair with RN, brace and walker. Patient tolerated very well. Foley removed at 0900. Patient left in room with call bell in reach. Will cont to monitor  Minor, Morrie Sheldon Jolyn Deshmukh   Pt sat in chair approx 40 min, then back to bed  Minor, [employer]

## 2013-02-24 NOTE — Evaluation (Signed)
Physical Therapy Evaluation Patient Details Name: Ramsey Midgett MRN: [id number] DOB: 11-28-1950 Today's Date: 02/24/2013 Time: 9147-8295 PT Time Calculation (min): 31 min  PT Assessment / Plan / Recommendation Clinical Impression  PT indicated to maximize functional mobility prior to d/c home with family.  Anticipated pt will be ready for d/c home from a mobility standpoint tomorrow or Monday.  PT needs to address stair training prior to d/c.    PT Assessment  Patient needs continued PT services    Follow Up Recommendations  No PT follow up;Supervision - Intermittent    Does the patient have the potential to tolerate intense rehabilitation      Barriers to Discharge None      Equipment Recommendations  None recommended by PT (Pt has RW.)    Recommendations for Other Services     Frequency Min 5X/week    Precautions / Restrictions Precautions Precautions: Back Precaution Booklet Issued: Yes (comment) Precaution Comments: Educated pt on 3/3  back precautions.  Handout provided. Required Braces or Orthoses: Spinal Brace Spinal Brace: Lumbar corset;Applied in sitting position   Pertinent Vitals/Pain 3/10      Mobility  Bed Mobility Bed Mobility: Supine to Sit Supine to Sit: 4: Min assist Details for Bed Mobility Assistance: verbal cues for logroll. Transfers Transfers: Sit to Stand;Stand to Sit Sit to Stand: 4: Min guard;From bed;With upper extremity assist Stand to Sit: 4: Min guard;To chair/3-in-1;With armrests Details for Transfer Assistance: verbal cues for hand placement, sequencing Ambulation/Gait Ambulation/Gait Assistance: 4: Min guard Ambulation Distance (Feet): 150 Feet Assistive device: Rolling walker Gait Pattern: Within Functional Limits    Exercises     PT Diagnosis: Difficulty walking;Acute pain  PT Problem List: Decreased mobility;Decreased knowledge of use of DME;Decreased knowledge of precautions;Pain PT Treatment Interventions: DME  instruction;Gait training;Stair training;Functional mobility training;Therapeutic activities;Patient/family education   PT Goals Acute Rehab PT Goals PT Goal Formulation: With patient Time For Goal Achievement: 03/03/13 Potential to Achieve Goals: Good Pt will go Supine/Side to Sit: with modified independence PT Goal: Supine/Side to Sit - Progress: Goal set today Pt will go Sit to Supine/Side: with modified independence PT Goal: Sit to Supine/Side - Progress: Goal set today Pt will go Sit to Stand: with modified independence PT Goal: Sit to Stand - Progress: Goal set today Pt will go Stand to Sit: with modified independence PT Goal: Stand to Sit - Progress: Goal set today Pt will Transfer Bed to Chair/Chair to Bed: with modified independence PT Transfer Goal: Bed to Chair/Chair to Bed - Progress: Goal set today Pt will Ambulate: >150 feet;with modified independence;with least restrictive assistive device PT Goal: Ambulate - Progress: Goal set today Pt will Go Up / Down Stairs: 3-5 stairs;with rail(s);with min assist PT Goal: Up/Down Stairs - Progress: Goal set today  Visit Information  Last PT Received On: 02/24/13 Assistance Needed: +1    Subjective Data  Subjective: "I sat up this morning." Patient Stated Goal: home   Prior Functioning  Home Living Lives With: Spouse Available Help at Discharge: Available 24 hours/day Type of Home: House Home Access: Stairs to enter Secretary/administrator of Steps: 5 Entrance Stairs-Rails: Right Home Layout: One level Bathroom Shower/Tub: Forensic scientist: Standard Bathroom Accessibility: Yes How Accessible: Accessible via walker Home Adaptive Equipment: Walker - rolling Prior Function Level of Independence: Independent Able to Take Stairs?: Yes Driving: Yes Vocation: Full time employment Communication Communication: No difficulties Dominant Hand: Right    Cognition  Cognition Overall Cognitive Status:  Appears within functional  limits for tasks assessed/performed Arousal/Alertness: Awake/alert Orientation Level: Oriented X4 / Intact Behavior During Session: [facility] for tasks performed    Extremity/Trunk Assessment Right Upper Extremity Assessment RUE ROM/Strength/Tone: [facility] for tasks assessed Left Upper Extremity Assessment LUE ROM/Strength/Tone: [facility] for tasks assessed Right Lower Extremity Assessment RLE ROM/Strength/Tone: [facility] for tasks assessed Left Lower Extremity Assessment LLE ROM/Strength/Tone: [facility] for tasks assessed Trunk Assessment Trunk Assessment: Normal   Balance Balance Balance Assessed:  (WFL for ADL)  End of Session PT - End of Session Equipment Utilized During Treatment: Gait belt;Back brace Activity Tolerance: Patient tolerated treatment well Patient left: in chair;with family/visitor present;with call bell/phone within reach Nurse Communication: Mobility status  GP Functional Assessment Tool Used: clinical judgement Functional Limitation: Mobility: Walking and moving around Mobility: Walking and Moving Around Current Status ([id number]): At least 1 percent but less than 20 percent impaired, limited or restricted Mobility: Walking and Moving Around Goal Status [phone]): 0 percent impaired, limited or restricted   Ilda Foil 02/24/2013, 1:05 PM  Aida Raider, PT  Office # [phone] Pager [phone]

## 2013-02-24 NOTE — Progress Notes (Signed)
GOAL: voiding post foley removal  ACCOMPLISHED: 02/24/13 1540  Minor, Morrie Sheldon Dmonte Maher

## 2013-02-25 MED ORDER — HYDROCODONE-ACETAMINOPHEN 5-325 MG PO TABS: 1.0000 | ORAL_TABLET | ORAL | Status: DC | PRN

## 2013-02-25 MED ORDER — CYCLOBENZAPRINE HCL 10 MG PO TABS: 10.0000 mg | ORAL_TABLET | Freq: Three times a day (TID) | ORAL | Status: DC | PRN

## 2013-02-25 NOTE — Progress Notes (Signed)
PLAN: Discharge in am. Pt has a RW at home  Minor, Yvette Rack

## 2013-02-25 NOTE — Progress Notes (Signed)
Subjective: Patient reports First no complaints back pain improving. No bowel movement yet  Objective: Vital signs in last 24 hours: Temp:  [97.7 F (36.5 C)-99.7 F (37.6 C)] 98.1 F (36.7 C) (04/13 0529) Pulse Rate:  [73-97] 78 (04/13 1042) Resp:  [18-20] 19 (04/13 1042) BP: (99-132)/(43-99) 112/56 mmHg (04/13 1042) SpO2:  [92 %-100 %] 95 % (04/13 0529)  Intake/Output from previous day: 04/12 0701 - 04/13 0700 In: 450 [P.O.:450] Out: -  Intake/Output this shift: Total I/O In: 220 [P.O.:220] Out: -   Incision is clean and dry motor function is  Lab Results: No results found for this basename: WBC, HGB, HCT, PLT,  in the last 72 hours BMET No results found for this basename: NA, K, CL, CO2, GLUCOSE, BUN, CREATININE, CALCIUM,  in the last 72 hours  Studies/Results: Dg Lumbar Spine 2-3 Views  02/23/2013  *RADIOLOGY REPORT*  Clinical Data: L4-5 PLIF  LUMBAR SPINE - 2-3 VIEW  Comparison: Same day  Findings: Two C-arm images show discectomy and fusion at L4-5. Interbody fusion material appears well positioned.  There are bilateral pedicle screws and posterior rods.  IMPRESSION: Good appearance following L4-5 PLIF.   Original Report Authenticated By: Paulina Fusi, M.D.    Dg Lumbar Spine 1 View  02/23/2013  *RADIOLOGY REPORT*  Clinical Data: L4-5 fusion  LUMBAR SPINE - 1 VIEW  Comparison: None.  Findings: Surgical instruments project over the L4 and L5 spinous processes.  5 mm anterolisthesis L4 upon L5.  No compression deformity.  IMPRESSION: Intraoperative localization at L4-5.   Original Report Authenticated By: Jolaine Click, M.D.     Assessment/Plan: Stable postop  LOS: 2 days  Plan discharge in a.m.   Sheryl Saintil J 02/25/2013, 11:07 AM

## 2013-02-25 NOTE — Progress Notes (Signed)
Physical Therapy Treatment Patient Details Name: Amanda Brooks MRN: [id number] DOB: 1951-11-04 Today's Date: 02/25/2013 Time: 4098-1191 PT Time Calculation (min): 26 min  PT Assessment / Plan / Recommendation Comments on Treatment Session  Excellent progress noted.  Plan is for d/c home tomorrow.    Follow Up Recommendations  No PT follow up;Supervision - Intermittent     Does the patient have the potential to tolerate intense rehabilitation     Barriers to Discharge        Equipment Recommendations  None recommended by PT    Recommendations for Other Services    Frequency Min 5X/week   Plan Discharge plan remains appropriate;Frequency remains appropriate    Precautions / Restrictions Precautions Precautions: Back Precaution Comments: Pt independently recalled 3/3 back precautions. Required Braces or Orthoses: Spinal Brace Spinal Brace: Lumbar corset;Applied in sitting position Restrictions Weight Bearing Restrictions: No   Pertinent Vitals/Pain 4/10    Mobility  Bed Mobility Bed Mobility: Sit to Supine Sit to Supine: 4: Min guard;With rail;HOB flat Details for Bed Mobility Assistance: verbal cues for logroll. Transfers Sit to Stand: 4: Min guard;From chair/3-in-1;With armrests Stand to Sit: 5: Supervision;To bed;With upper extremity assist Details for Transfer Assistance: verbal cues for sequencing Ambulation/Gait Ambulation/Gait Assistance: 4: Min guard Ambulation Distance (Feet): 150 Feet (x 2) Assistive device: Rolling walker Gait Pattern: Within Functional Limits Stairs: Yes Stairs Assistance: 4: Min guard Stair Management Technique: Forwards;One rail Right Number of Stairs: 3 (x 2)    Exercises     PT Diagnosis:    PT Problem List:   PT Treatment Interventions:     PT Goals Acute Rehab PT Goals PT Goal: Supine/Side to Sit - Progress: Progressing toward goal PT Goal: Sit to Supine/Side - Progress: Progressing toward goal PT Goal: Sit to Stand -  Progress: Progressing toward goal PT Goal: Stand to Sit - Progress: Progressing toward goal PT Transfer Goal: Bed to Chair/Chair to Bed - Progress: Progressing toward goal PT Goal: Ambulate - Progress: Progressing toward goal PT Goal: Up/Down Stairs - Progress: Met  Visit Information  Last PT Received On: 02/25/13 Assistance Needed: +1    Subjective Data  Subjective: "I have been in the chair since 6:00 AM." Patient Stated Goal: home tomorrow   Cognition  Cognition Overall Cognitive Status: Appears within functional limits for tasks assessed/performed Arousal/Alertness: Awake/alert Orientation Level: Oriented X4 / Intact Behavior During Session: [facility] for tasks performed    Balance     End of Session PT - End of Session Equipment Utilized During Treatment: Gait belt;Back brace Activity Tolerance: Patient tolerated treatment well Patient left: in bed;with call bell/phone within reach   GP Functional Assessment Tool Used: clinical judgement Functional Limitation: Mobility: Walking and moving around Mobility: Walking and Moving Around Current Status (Y7829): At least 1 percent but less than 20 percent impaired, limited or restricted Mobility: Walking and Moving Around Goal Status [phone]): 0 percent impaired, limited or restricted   Ilda Foil 02/25/2013, 10:53 AM  Aida Raider, PT  Office # [phone] Pager [phone]

## 2013-02-26 NOTE — Clinical Social Work Note (Signed)
Clinical Social Work   CSW received consult for SNF. CSW reviewed chart and discussed with RN during progression. PT/OT are not recommending any follow up. Pt is ready ready for discharge. CSW is signing off as no further needs identified.   Dede Query, MSW, LCSW [phone]

## 2013-02-26 NOTE — Progress Notes (Signed)
Occupational Therapy Treatment Patient Details Name: Amanda Brooks MRN: [id number] DOB: Feb 13, 1951 Today's Date: 02/26/2013 Time:  -     OT Assessment / Plan / Recommendation Comments on Treatment Session All education completed. all goals met. Pt ready for D/C.     Follow Up Recommendations  No OT follow up    Barriers to Discharge   none    Equipment Recommendations  3 in 1 bedside comode    Recommendations for Other Services    Frequency Min 2X/week   Plan Discharge plan remains appropriate    Precautions / Restrictions Precautions Precautions: Back Precaution Booklet Issued: Yes (comment) Precaution Comments: independently recalled 3/3 back precautions Required Braces or Orthoses: Spinal Brace Spinal Brace: Lumbar corset;Applied in sitting position Restrictions Weight Bearing Restrictions: No   Pertinent Vitals/Pain no apparent distress     ADL  Toilet Transfer: Modified independent Toilet Transfer Method: Other (comment) (ambulating) Toilet Transfer Equipment: Comfort height toilet;Bedside commode Toileting - Clothing Manipulation and Hygiene: Modified independent Where Assessed - Toileting Clothing Manipulation and Hygiene: Sit to stand from 3-in-1 or toilet Equipment Used: Back brace;Gait belt;Rolling walker Transfers/Ambulation Related to ADLs: mod I ADL Comments: completed ADL educaiton regarding compensatory techniques, AE and DME use. Pt/family verbalized and demonstrted understanding.    OT Diagnosis:    OT Problem List:   OT Treatment Interventions:     OT Goals Acute Rehab OT Goals OT Goal Formulation: With patient Time For Goal Achievement: 03/03/13 Potential to Achieve Goals: Good ADL Goals Pt Will Perform Lower Body Bathing: with supervision;with caregiver independent in assisting;Sit to stand from chair;Unsupported;with adaptive equipment ADL Goal: Lower Body Bathing - Progress: Met Pt Will Perform Lower Body Dressing: with supervision;with  caregiver independent in assisting;Unsupported;with adaptive equipment ADL Goal: Lower Body Dressing - Progress: Met Pt Will Transfer to Toilet: with modified independence;Ambulation;with DME;3-in-1 ADL Goal: Toilet Transfer - Progress: Met Pt Will Perform Toileting - Clothing Manipulation: with modified independence;Standing;Sitting on 3-in-1 or toilet;with adaptive equipment ADL Goal: Toileting - Clothing Manipulation - Progress: Met Additional ADL Goal #1: Pt will independently verbalize 3/3 back precautions ADL Goal: Additional Goal #1 - Progress: Met  Visit Information  Last OT Received On: 02/26/13 Assistance Needed: +1    Subjective Data      Prior Functioning       Cognition  Cognition Overall Cognitive Status: Appears within functional limits for tasks assessed/performed Arousal/Alertness: Awake/alert Orientation Level: Oriented X4 / Intact Behavior During Session: [facility] for tasks performed    Mobility  Bed Mobility Bed Mobility: Sitting - Scoot to Edge of Bed;Left Sidelying to Sit Left Sidelying to Sit: HOB flat;6: Modified independent (Device/Increase time) Sitting - Scoot to Edge of Bed: 6: Modified independent (Device/Increase time) Details for Bed Mobility Assistance: demo good log roll technique; able to transfer supine to sitting EOB without the use of rails  Transfers Transfers: Sit to Stand;Stand to Sit Sit to Stand: 6: Modified independent (Device/Increase time) Stand to Sit: 6: Modified independent (Device/Increase time) Details for Transfer Assistance: demo good technique with transfers; required increased time to complete tasks    Exercises  Other Exercises Other Exercises: Discussed car transfer stratgies with pt and husband; pt and husband verbalized understanding.    Balance Balance Balance Assessed: No   End of Session OT - End of Session Equipment Utilized During Treatment: Back brace Activity Tolerance: Patient tolerated treatment well Patient  left: in chair;with call bell/phone within reach;with family/visitor present Nurse Communication: Mobility status;Precautions  GO  Amanda Brooks,Amanda Brooks 02/26/2013, 2:09 PM [facility], OTR/L  [phone] 02/26/2013

## 2013-02-26 NOTE — Progress Notes (Signed)
Physical Therapy Treatment Patient Details Name: Amanda Brooks MRN: [id number] DOB: [birth year] Today's Date: 02/26/2013 Time: 1100-1116 PT Time Calculation (min): 16 min  PT Assessment / Plan / Recommendation Comments on Treatment Session  Pt making great progress. Is clear from PT standpoint to D/C home today with husband care.     Follow Up Recommendations  No PT follow up;Supervision - Intermittent     Does the patient have the potential to tolerate intense rehabilitation     Barriers to Discharge        Equipment Recommendations  None recommended by PT    Recommendations for Other Services    Frequency Min 5X/week   Plan Discharge plan remains appropriate;Frequency remains appropriate    Precautions / Restrictions Precautions Precautions: Back Precaution Booklet Issued: Yes (comment) Precaution Comments: independently recalled 3/3 back precautions Required Braces or Orthoses: Spinal Brace Spinal Brace: Lumbar corset;Applied in sitting position Restrictions Weight Bearing Restrictions: No   Pertinent Vitals/Pain Denies pain. No new complaints.    Mobility  Bed Mobility Bed Mobility: Sitting - Scoot to Edge of Bed;Left Sidelying to Sit Left Sidelying to Sit: HOB flat;6: Modified independent (Device/Increase time) Sitting - Scoot to Edge of Bed: 6: Modified independent (Device/Increase time) Details for Bed Mobility Assistance: demo good log roll technique; able to transfer supine to sitting EOB without the use of rails  Transfers Transfers: Sit to Stand;Stand to Sit Sit to Stand: From bed;6: Modified independent (Device/Increase time) Stand to Sit: With armrests;To chair/3-in-1;6: Modified independent (Device/Increase time) Details for Transfer Assistance: demo good technique with transfers; required increased time to complete tasks Ambulation/Gait Ambulation/Gait Assistance: 5: Supervision Ambulation Distance (Feet): 200 Feet Assistive device: Rolling  walker Ambulation/Gait Assistance Details: pt demo good technique and safety with RW; good ability to adhere to back precautions  Gait Pattern: Within Functional Limits Gait velocity: decreased Stairs: Yes Stairs Assistance: 5: Supervision Stairs Assistance Details (indicate cue type and reason): demo good safety awareness with step amb Stair Management Technique: Forwards;One rail Right Number of Stairs: 5 Wheelchair Mobility Wheelchair Mobility: No    Exercises Other Exercises Other Exercises: Discussed car transfer stratgies with pt and husband; pt and husband verbalized understanding.    PT Diagnosis:    PT Problem List:   PT Treatment Interventions:     PT Goals Acute Rehab PT Goals PT Goal Formulation: With patient Time For Goal Achievement: 03/03/13 Potential to Achieve Goals: Good PT Goal: Supine/Side to Sit - Progress: Met PT Goal: Sit to Stand - Progress: Met PT Goal: Stand to Sit - Progress: Met PT Transfer Goal: Bed to Chair/Chair to Bed - Progress: Met PT Goal: Ambulate - Progress: Partly met PT Goal: Up/Down Stairs - Progress: Met  Visit Information  Last PT Received On: 02/26/13 Assistance Needed: +1    Subjective Data  Subjective: "I guess you are going to drag me out of this bed and make me walk. I am going home today"  Patient Stated Goal: home today   Cognition  Cognition Overall Cognitive Status: Appears within functional limits for tasks assessed/performed Arousal/Alertness: Awake/alert Orientation Level: Oriented X4 / Intact Behavior During Session: [facility] for tasks performed    Balance  Balance Balance Assessed: No  End of Session PT - End of Session Equipment Utilized During Treatment: Gait belt;Back brace Activity Tolerance: Patient tolerated treatment well Patient left: in chair;with call bell/phone within reach;with family/visitor present Nurse Communication: Mobility status   GP     Donell Sievert, [place] [phone] 02/26/2013, 12:37  PM   

## 2013-02-26 NOTE — Care Management Note (Signed)
    Page 1 of 1   02/26/2013     11:58:16 AM   CARE MANAGEMENT NOTE 02/26/2013  Patient:  Amanda Brooks, Amanda Brooks   Account Number:  [id number]  Date Initiated:  02/26/2013  Documentation initiated by:  Jacquelynn Cree  Subjective/Objective Assessment:   admitted postop L4-5 PLIF     Action/Plan:   PT/OT evals- no follow up recommended   Anticipated DC Date:  02/26/2013   Anticipated DC Plan:  HOME/SELF CARE      DC Planning Services  CM consult      Choice offered to / List presented to:     DME arranged  3-N-1      DME agency  [facility]        Status of service:  Completed, signed off Medicare Important Message given?   (If response is "NO", the following Medicare IM given date fields will be blank) Date Medicare IM given:   Date Additional Medicare IM given:    Discharge Disposition:  HOME/SELF CARE  Per UR Regulation:  Reviewed for med. necessity/level of care/duration of stay  If discussed at Long Length of Stay Meetings, dates discussed:    Comments:  02/26/13 Spoke with patient about d/c plans, she would like 3N1 as recommended by PT. Lives with husband.Contacted Darian with [facility] and requested 3N1 to be delivered to patient's room prior to d/c. Jacquelynn Cree RN, [facility]

## 2013-02-28 MED FILL — Heparin Sodium (Porcine) Inj 1000 Unit/ML: INTRAMUSCULAR | Qty: 30 | Status: AC

## 2013-02-28 MED FILL — Sodium Chloride IV Soln 0.9%: INTRAVENOUS | Qty: 1000 | Status: AC

## 2013-03-01 NOTE — Discharge Summary (Signed)
Physician Discharge Summary  Patient ID: Amanda Brooks MRN: [id number] DOB/AGE: [birth year] 61 y.o.  Admit date: 02/23/2013 Discharge date: 03/01/2013  Admission Diagnoses: Lumbar spondylosis L4-L5 with stenosis and radiculopathy acquired spondylolisthesis  Discharge Diagnoses: Lumbar spondylosis L4-L5 with stenosis and radiculopathy acquired spondylolisthesis Active Problems:   * No active hospital problems. *   Discharged Condition: good  Hospital Course: A shunt was admitted to undergo surgical decompression and stabilization of a spondylolisthesis with severe stenosis at L4-L5. She tolerated the procedure well. She is discharged home  Consults: None  Significant Diagnostic Studies: None  Treatments: surgery: Decompression L4-L5 pedicle screw fixation L4-L5 with posterior lateral arthrodesis  Discharge Exam: Blood pressure 106/65, pulse 78, temperature 98 F (36.7 C), temperature source Oral, resp. rate 18, height 5\' 6"  (1.676 m), weight 68.04 kg (150 lb), SpO2 98.00%. Motor function is intact incision is clean and dry station and gait are normal.  Disposition: 01-Home or Self Care  Discharge Orders   Future Orders Complete By Expires     Call MD for:  redness, tenderness, or signs of infection (pain, swelling, redness, odor or green/yellow discharge around incision site)  As directed     Call MD for:  severe uncontrolled pain  As directed     Call MD for:  temperature >100.4  As directed     Diet - low sodium heart healthy  As directed     Discharge instructions  As directed     Comments:      Okay to shower. Do not apply salves or appointments to incision. No heavy lifting with the upper extremities greater than 15 pounds. May resume driving when not requiring pain medication and patient feels comfortable with doing so.    Increase activity slowly  As directed         Medication List    TAKE these medications       citalopram 40 MG tablet  Commonly known as:  CELEXA   Take 40 mg by mouth daily.     cyclobenzaprine 10 MG tablet  Commonly known as:  FLEXERIL  Take 1 tablet (10 mg total) by mouth 3 (three) times daily as needed for muscle spasms.     HYDROcodone-acetaminophen 5-325 MG per tablet  Commonly known as:  NORCO/VICODIN  Take 1-2 tablets by mouth every 4 (four) hours as needed for pain.     meloxicam 15 MG tablet  Commonly known as:  MOBIC  Take 15 mg by mouth daily.     omeprazole 20 MG capsule  Commonly known as:  PRILOSEC  Take 20 mg by mouth daily.     propranolol ER 160 MG SR capsule  Commonly known as:  INDERAL LA  Take 160 mg by mouth daily.     rOPINIRole 2 MG tablet  Commonly known as:  REQUIP  Take 2 mg by mouth at bedtime.     SUMAtriptan 100 MG tablet  Commonly known as:  IMITREX  Take 100 mg by mouth every 2 (two) hours as needed for migraine.     Vitamin D3 2000 UNITS Tabs  Take 2,000 Units by mouth daily.         SignedStefani Dama 03/01/2013, 6:36 PM

## 2013-07-28 ENCOUNTER — Emergency Department: Payer: Self-pay | Admitting: Emergency Medicine

## 2014-07-26 ENCOUNTER — Ambulatory Visit: Payer: Self-pay | Admitting: Unknown Physician Specialty

## 2016-03-19 ENCOUNTER — Other Ambulatory Visit: Payer: Self-pay | Admitting: Neurological Surgery

## 2016-03-19 DIAGNOSIS — M5416 Radiculopathy, lumbar region: Secondary | ICD-10-CM

## 2016-04-08 ENCOUNTER — Ambulatory Visit
Admission: RE | Admit: 2016-04-08 | Discharge: 2016-04-08 | Disposition: A | Discharge status: Home / Self Care | Payer: BLUE CROSS/BLUE SHIELD | Source: Ambulatory Visit | Attending: Neurological Surgery | Admitting: Neurological Surgery

## 2016-04-08 DIAGNOSIS — Z9889 Other specified postprocedural states: Secondary | ICD-10-CM | POA: Insufficient documentation

## 2016-04-08 DIAGNOSIS — M4806 Spinal stenosis, lumbar region: Secondary | ICD-10-CM | POA: Insufficient documentation

## 2016-04-08 DIAGNOSIS — M5127 Other intervertebral disc displacement, lumbosacral region: Secondary | ICD-10-CM | POA: Insufficient documentation

## 2016-04-08 DIAGNOSIS — M5416 Radiculopathy, lumbar region: Secondary | ICD-10-CM | POA: Insufficient documentation

## 2016-04-08 MED ORDER — GADOBENATE DIMEGLUMINE 529 MG/ML IV SOLN
15.0000 mL | Freq: Once | INTRAVENOUS | Status: AC | PRN
  Administered 2016-04-08: 13 mL via INTRAVENOUS

## 2016-08-02 ENCOUNTER — Encounter: Payer: Self-pay | Admitting: Physical Therapy

## 2016-08-02 ENCOUNTER — Ambulatory Visit: Payer: BLUE CROSS/BLUE SHIELD | Attending: Neurological Surgery | Admitting: Physical Therapy

## 2016-08-02 DIAGNOSIS — R293 Abnormal posture: Secondary | ICD-10-CM | POA: Diagnosis present

## 2016-08-02 DIAGNOSIS — M5442 Lumbago with sciatica, left side: Secondary | ICD-10-CM | POA: Diagnosis present

## 2016-08-02 DIAGNOSIS — R262 Difficulty in walking, not elsewhere classified: Secondary | ICD-10-CM | POA: Insufficient documentation

## 2016-08-02 NOTE — Therapy (Signed)
[facility] [address] [place], [place], [address] Phone: [phone]   Fax:  [phone]  Physical Therapy Treatment  Patient Details  Name: Amanda Brooks MRN: [id number] Date of Birth: 10/17/1951 Referring Provider: Stefani Dama MD  Encounter Date: 08/02/2016      PT End of Session - 08/02/16 1244    Visit Number 1   Number of Visits 8   Date for PT Re-Evaluation 08/26/16   PT Start Time 0900   PT Stop Time 0947   PT Time Calculation (min) 47 min   Activity Tolerance Patient tolerated treatment well   Behavior During Therapy [facility] for tasks assessed/performed      Past Medical History:  Diagnosis Date  . Anxiety   . Arthritis   . GERD (gastroesophageal reflux disease)   . Hypertension    Takes propranolol for HTN Does not see a cardiologist  . Migraine headache   . Restless leg syndrome   . Vitamin D deficiency     Past Surgical History:  Procedure Laterality Date  . APPENDECTOMY     done as part of tubal ligation  . TUBAL LIGATION      There were no vitals filed for this visit.      Subjective Assessment - 08/02/16 1237    Subjective Pt reports a long history of back pain starting in 2013. She experienced back pain for over a year, tried physical therapy with no stated benefit and opted for surgery (posterior lumbar fusion L4-L5 02/23/13). States that the surgery relieved her of all low back and leg pain until 8 months ago when it started to hurt again. She experienced shooting pain down her left leg and a deep ache in L buttock, knee and foot. Pt states that in June of this year her doctor diagnosed her with a ruptured disc and performed a second surgery; states that she was without pain for 2 weeks but not she is experiencing it all the time. She states that at times it is so severe she cannot walk. Her normal activity level is fairly sedentary but she enjoys walking and going to her grandchildrens' ball games.  She works as a Catering manager but states that she has a standing desk to address ergonomic concerns. Pt states that she uses her standing desk but has pain with extended periods of standing as well. She says she can move better than she can stand still. Pt denies bowel/bladder issues and N/T.    Pertinent History 02/24/16: L4-L5 fusion.    Limitations Sitting;Lifting;Standing;Walking;House hold activities   Diagnostic tests MRI 04/08/16: L5-S1 L moderate disc protrusion with mass effect on L intraspinal S1 nerve root, L3--L4 mild disc bulge, moderate right facet arthropathy and right lateral recess stenosis, mild spinal stenosis   Patient Stated Goals pt would like to be able to perform regular exercise (walk and grandchilden ball games) without back pain   Currently in Pain? Yes   Pain Score 2    Pain Location Back   Pain Orientation Left   Pain Descriptors / Indicators Constant   Pain Type Chronic pain   Pain Onset More than a month ago   Pain Frequency Constant   Aggravating Factors  standing, sitting, lifting, bending            [facility] PT Assessment - 08/02/16 0001      Assessment   Medical Diagnosis radiculopathy, lumbar region   Referring Provider Stefani Dama MD  Onset Date/Surgical Date 01/03/16   Hand Dominance Right   Prior Therapy prior PT in 2014; no prior PT to current episode      Objective:  Therapeutic Exercise: Hooklying lumbar rot R/L x10; R sided hold 30 sec. Child's pose 30 sec x1 with L opening. Hip flexor stretch 45 sec x2. Hamstring stretch 45 sec x2. Seated piriformis stretch 45 sec x2.  Pt response for medical necessity: Pt presents with chronic hx of low back/LLE pain and mild lumbar mobility deficits. She has functional LE muscle length but demonstrates moderate variation from L to R, with L less mobile than R. She has positive response to R rotation with relief of L sided sx. Pt tolerates all therapeutic exercise/HEP program with no increase in pain and  leaves PT session with decrease in low back pain/LLE pain.      PT Education - 08/02/16 1243    Education provided Yes   Education Details HEP program: LE stretching and lumbar rotation, child's pose   Person(s) Educated Patient   Methods Explanation;Demonstration;Handout   Comprehension Verbalized understanding;Returned demonstration           PT Long Term Goals - 08/02/16 1422      PT LONG TERM GOAL #1   Title Pt will score <20% on MODI to promote improvement of percieved functional mobility   Baseline 08/02/16: MODI 38% moderate self percieved disability   Time 4   Period Weeks   Status New     PT LONG TERM GOAL #2   Title Pt will demonstrate increase by 1/2 muscle grade per MMT of LLE affected musculature to promote increase in functional mobility/stability   Baseline MMT LE R/L hip flexion 4+/4+, knee extension 5/5, knee flexion 5/4, dorsiflexion 5/4, plantarflexion 5/4-   Time 4   Period Weeks   Status New     PT LONG TERM GOAL #3   Title Pt will be able to tolerate lumbar ROM all planes with <2/10 LBP/LLE pain to promote return to PLOF   Baseline ROM: lumbar flexion - fingertips to distal tibia; pain limited, lumbar ext WNL; end range pain, lumbar lateral flexion R/L WNL, lumbar rotation R WNL, lumbar rotation L mild limitation secondary to onset of 4/10 LBP   Time 4   Period Weeks   Status New     PT LONG TERM GOAL #4   Title Pt will be able to pick up a 5lb object with <3/10 LBP/LLE pain so that she can perform ADLs within low pain tolerance   Baseline pt reporting lifting objects as a major aggravating factor for onset of severe LBP   Time 4   Period Weeks   Status New     PT LONG TERM GOAL #5   Title Pt will be able to tolerate a 7 hr work day with <3/10 LBP/LLE pain   Baseline pt with moments of "12/10" pain during work day, with incorporated use of standing desk   Time 4   Period Weeks   Status New            Plan - 08/02/16 1244    Clinical  Impression Statement Pt is a pleasant 65 year old female referred to physical therapy for hx of chronic low back pain. Pt has underwent L4-L5 fusion in April of 2014 and was pain free until approximately 8 months ago. Gait: pt with mild antalgic gait secondary to LLE pain, limited trunk rotation and arm swing bilaterally. Strength: MMT LE R/L hip  flexion 4+/4+, knee extension 5/5, knee flexion 5/4, dorsiflexion 5/4, plantarflexion 5/4-. Sensation: intact to LT, no N/T. ROM: lumbar flexion - fingertips to distal tibia; pain limited, lumbar ext WNL; end range pain, lumbar lateral flexion R/L WNL, lumbar rotation R WNL, lumbar rotation L mild limitation secondary to onset of 4/10 LBP. Passive Accessory: pt with moderate hypomobility of sacrum, pt with TTP CPA L2-L5. Mild hypertonicity of R/L paraspinals. Muscle Length: hamstring length WFL but L more restricted than R, piriformis L<R but WFL. Special Tests: Slump Test (-), SLR (-). Outcome Measures: Modified Oswestry: 38%   Rehab Potential Fair   Clinical Impairments Affecting Rehab Potential Negative: Chronic, longterm post surgical   PT Frequency 2x / week   PT Duration 4 weeks   PT Treatment/Interventions ADLs/Self Care Home Management;Aquatic Therapy;Biofeedback;Cryotherapy;Electrical Stimulation;Moist Heat;Ultrasound;Gait training;Stair training;Functional mobility training;Therapeutic activities;Therapeutic exercise;Balance training;Neuromuscular re-education;Patient/family education;Manual techniques;Passive range of motion;Dry needling   PT Next Visit Plan progressive mobility program, LE strengthening of proximal musculature   PT Home Exercise Plan see pt instructions   Consulted and Agree with Plan of Care Patient      Patient will benefit from skilled therapeutic intervention in order to improve the following deficits and impairments:  Abnormal gait, Decreased activity tolerance, Decreased balance, Decreased coordination, Decreased endurance,  Decreased mobility, Decreased range of motion, Decreased strength, Difficulty walking, Hypomobility, Increased muscle spasms, Impaired flexibility, Improper body mechanics, Postural dysfunction, Pain  Visit Diagnosis: Abnormal posture  Difficulty in walking, not elsewhere classified  Left-sided low back pain with left-sided sciatica     Problem List There are no active problems to display for this patient.  Cammie McgeeMichael C Sherk, PT, DPT # [id number] Michaelyn BarterLaura Enya Bureau, SPT 08/02/2016, 2:30 PM  [facility] [address] [place], [place], [address] Phone: [phone]   Fax:  [phone]  Name: Amanda Brooks MRN: [id number] Date of Birth: 12/11/1950

## 2016-08-05 ENCOUNTER — Encounter: Payer: Self-pay | Admitting: Physical Therapy

## 2016-08-05 ENCOUNTER — Ambulatory Visit: Payer: BLUE CROSS/BLUE SHIELD | Admitting: Physical Therapy

## 2016-08-05 DIAGNOSIS — R262 Difficulty in walking, not elsewhere classified: Secondary | ICD-10-CM

## 2016-08-05 DIAGNOSIS — M5442 Lumbago with sciatica, left side: Secondary | ICD-10-CM

## 2016-08-05 DIAGNOSIS — R293 Abnormal posture: Secondary | ICD-10-CM | POA: Diagnosis not present

## 2016-08-05 NOTE — Therapy (Signed)
[facility] [address] [place], [place], [address] Phone: [phone]   Fax:  [phone]  Physical Therapy Treatment  Patient Details  Name: Amanda Brooks MRN: [id number] Date of Birth: 08/09/1951 Referring Provider: Stefani DamaHenry J Elsner MD  Encounter Date: 08/05/2016      PT End of Session - 08/05/16 1237    Visit Number 2   Number of Visits 8   Date for PT Re-Evaluation 08/26/16   PT Start Time 0900   PT Stop Time 0946   PT Time Calculation (min) 46 min   Activity Tolerance Patient tolerated treatment well   Behavior During Therapy [facility] for tasks assessed/performed      Past Medical History:  Diagnosis Date  . Anxiety   . Arthritis   . GERD (gastroesophageal reflux disease)   . Hypertension    Takes propranolol for HTN Does not see a cardiologist  . Migraine headache   . Restless leg syndrome   . Vitamin D deficiency     Past Surgical History:  Procedure Laterality Date  . APPENDECTOMY     done as part of tubal ligation  . TUBAL LIGATION      There were no vitals filed for this visit.      Subjective Assessment - 08/05/16 1033    Subjective Pt states that she has been performing HEP program since last appointment. Reports no pain but states low level discomfort. She states that she typically is able to get through most of her work day without exacerbation of L sided back/leg pain but notices that around 2-3pm she starts to experience pain.   Pertinent History 02/24/16: L4-L5 fusion.    Limitations Sitting;Lifting;Standing;Walking;House hold activities   Diagnostic tests MRI 04/08/16: L5-S1 L moderate disc protrusion with mass effect on L intraspinal S1 nerve root, L3--L4 mild disc bulge, moderate right facet arthropathy and right lateral recess stenosis, mild spinal stenosis   Patient Stated Goals pt would like to be able to perform regular exercise (walk and grandchilden ball games) without back pain   Currently in  Pain? No/denies      Objective:  Therapeutic Exercise: Open book x10 in R sidelying/L opening. SLR 2x10 LLE, 1x10 RLE. Bridge 1x10, 1x10 with YTB for glute act, 1x10 with arms crossed and YTB. Clamshell with YTB 2x10 R/L. Lumbar rotations in supine with white theraball x30 R/L.   Pt response for medical necessity: Pt with no c/o of low back pain/LLE pain this session. Demonstrates weakness of hip flexors/hip abductors during therapeutic ex but able to perform with no compensation/substitutions.       PT Education - 08/05/16 1236    Education provided Yes   Education Details HEP: open book rotation, SLR, bridge, clamshell   Person(s) Educated Patient   Methods Explanation;Demonstration;Handout   Comprehension Returned demonstration;Verbalized understanding             PT Long Term Goals - 08/02/16 1422      PT LONG TERM GOAL #1   Title Pt will score <20% on MODI to promote improvement of percieved functional mobility   Baseline 08/02/16: MODI 38% moderate self percieved disability   Time 4   Period Weeks   Status New     PT LONG TERM GOAL #2   Title Pt will demonstrate increase by 1/2 muscle grade per MMT of LLE affected musculature to promote increase in functional mobility/stability   Baseline MMT LE R/L hip flexion 4+/4+, knee extension 5/5, knee  flexion 5/4, dorsiflexion 5/4, plantarflexion 5/4-   Time 4   Period Weeks   Status New     PT LONG TERM GOAL #3   Title Pt will be able to tolerate lumbar ROM all planes with <2/10 LBP/LLE pain to promote return to PLOF   Baseline ROM: lumbar flexion - fingertips to distal tibia; pain limited, lumbar ext WNL; end range pain, lumbar lateral flexion R/L WNL, lumbar rotation R WNL, lumbar rotation L mild limitation secondary to onset of 4/10 LBP   Time 4   Period Weeks   Status New     PT LONG TERM GOAL #4   Title Pt will be able to pick up a 5lb object with <3/10 LBP/LLE pain so that she can perform ADLs within low pain  tolerance   Baseline pt reporting lifting objects as a major aggravating factor for onset of severe LBP   Time 4   Period Weeks   Status New     PT LONG TERM GOAL #5   Title Pt will be able to tolerate a 7 hr work day with <3/10 LBP/LLE pain   Baseline pt with moments of "12/10" pain during work day, with incorporated use of standing desk   Time 4   Period Weeks   Status New               Plan - 08/05/16 1237    Clinical Impression Statement Session emphasis with proximal LE/core strengthening focus. Pt tolerated all therapeutic exercise without onset of L sided back pain. Demonstrates remarkable weakness of L hip flexors and abductors with issued exercises. Pt with fair understanding of TrA activation; difficulty performing without breath holding; will benefit from review next session.   Rehab Potential Fair   Clinical Impairments Affecting Rehab Potential Negative: Chronic, longterm post surgical   PT Frequency 2x / week   PT Duration 4 weeks   PT Treatment/Interventions ADLs/Self Care Home Management;Aquatic Therapy;Biofeedback;Cryotherapy;Electrical Stimulation;Moist Heat;Ultrasound;Gait training;Stair training;Functional mobility training;Therapeutic activities;Therapeutic exercise;Balance training;Neuromuscular re-education;Patient/family education;Manual techniques;Passive range of motion;Dry needling   PT Next Visit Plan progressive mobility program, LE strengthening of proximal musculature   PT Home Exercise Plan see pt instructions   Consulted and Agree with Plan of Care Patient      Patient will benefit from skilled therapeutic intervention in order to improve the following deficits and impairments:  Abnormal gait, Decreased activity tolerance, Decreased balance, Decreased coordination, Decreased endurance, Decreased mobility, Decreased range of motion, Decreased strength, Difficulty walking, Hypomobility, Increased muscle spasms, Impaired flexibility, Improper body  mechanics, Postural dysfunction, Pain  Visit Diagnosis: Abnormal posture  Difficulty in walking, not elsewhere classified  Left-sided low back pain with left-sided sciatica     Problem List There are no active problems to display for this patient.  Cammie Mcgee, PT, DPT # [id number] Vernona Rieger Hearl Heikes SPT 08/05/2016, 12:40 PM  [facility] [address] [place], [place], [address] Phone: [phone]   Fax:  [phone]  Name: Amanda Brooks MRN: [id number] Date of Birth: [birth year]

## 2016-08-09 ENCOUNTER — Ambulatory Visit: Payer: BLUE CROSS/BLUE SHIELD | Admitting: Physical Therapy

## 2016-08-09 ENCOUNTER — Encounter: Payer: Self-pay | Admitting: Physical Therapy

## 2016-08-09 DIAGNOSIS — M5442 Lumbago with sciatica, left side: Secondary | ICD-10-CM

## 2016-08-09 DIAGNOSIS — R293 Abnormal posture: Secondary | ICD-10-CM | POA: Diagnosis not present

## 2016-08-09 DIAGNOSIS — R262 Difficulty in walking, not elsewhere classified: Secondary | ICD-10-CM

## 2016-08-09 NOTE — Therapy (Signed)
[facility] [address] [place], [place], [address] Phone: [phone]   Fax:  [phone]  Physical Therapy Treatment  Patient Details  Name: NICHOLA CIESLINSKI MRN: [id number] Date of Birth: 1951-02-12 Referring Provider: Stefani Dama MD  Encounter Date: 08/09/2016      PT End of Session - 08/09/16 1241    Visit Number 3   Number of Visits 8   Date for PT Re-Evaluation 08/26/16   PT Start Time 0945   PT Stop Time 1036   PT Time Calculation (min) 51 min   Activity Tolerance Patient tolerated treatment well   Behavior During Therapy [facility] for tasks assessed/performed      Past Medical History:  Diagnosis Date  . Anxiety   . Arthritis   . GERD (gastroesophageal reflux disease)   . Hypertension    Takes propranolol for HTN Does not see a cardiologist  . Migraine headache   . Restless leg syndrome   . Vitamin D deficiency     Past Surgical History:  Procedure Laterality Date  . APPENDECTOMY     done as part of tubal ligation  . TUBAL LIGATION      There were no vitals filed for this visit.      Subjective Assessment - 08/09/16 1234    Subjective Pt states no problems with HEP/no exacerbation of sx. States her back pain is typically aggravated with mopping/vaccuming. Pt states that she experiences lateral shin pain on LLE when LBP is aggravated; states she notices this more if she is wearing flip flops/barefoot.   Pertinent History 02/24/16: L4-L5 fusion.    Limitations Sitting;Lifting;Standing;Walking;House hold activities   Diagnostic tests MRI 04/08/16: L5-S1 L moderate disc protrusion with mass effect on L intraspinal S1 nerve root, L3--L4 mild disc bulge, moderate right facet arthropathy and right lateral recess stenosis, mild spinal stenosis   Patient Stated Goals pt would like to be able to perform regular exercise (walk and grandchilden ball games) without back pain   Currently in Pain? Yes   Pain Score 2    Pain  Location Back   Pain Orientation Left   Pain Descriptors / Indicators Constant   Pain Type Chronic pain   Pain Onset More than a month ago   Pain Frequency Constant       Objective:  6 minutes treadmill 1.0 mph; cueing for lumbar ext, tendency for lumbar flexion. Pt notes increase of LBP from 2/10 to 3/10 (warm up/no charge). Pt with normalized gait with UE support; mild L antalgic gait without support secondary to feelings of instability.  Therapeutic Exercise: Green theraball x15 hip flexion/knee extension R/L ea. Lateral flexion x2 1 minute hold on green theraball. Lumbar rot seated on green theraball x15. Hamstring stretch 1 minute x2 R/L. Heel raises 2x15. Seated dorsiflexion with BTB 2x15. Seated eversion/inversion BTB 2x15.  Neuromuscular Re-ed: In// bars - tandem 30sec x4. Single leg stance 20sec x6   Pt response for medical necessity: Pt with low level LBP this session with no consistent response to treatment. She is able to tolerate all therapeutic exercise with no exacerbation of sx. Demonstrates weakness/instability L ankle with pt stating she experiences lateral calf pain along with LBP.       PT Education - 08/09/16 1238    Education provided Yes   Education Details ankle resisted exercise   Person(s) Educated Patient   Methods Explanation;Demonstration;Handout   Comprehension Verbalized understanding;Returned demonstration  PT Long Term Goals - 08/02/16 1422      PT LONG TERM GOAL #1   Title Pt will score <20% on MODI to promote improvement of percieved functional mobility   Baseline 08/02/16: MODI 38% moderate self percieved disability   Time 4   Period Weeks   Status New     PT LONG TERM GOAL #2   Title Pt will demonstrate increase by 1/2 muscle grade per MMT of LLE affected musculature to promote increase in functional mobility/stability   Baseline MMT LE R/L hip flexion 4+/4+, knee extension 5/5, knee flexion 5/4, dorsiflexion 5/4,  plantarflexion 5/4-   Time 4   Period Weeks   Status New     PT LONG TERM GOAL #3   Title Pt will be able to tolerate lumbar ROM all planes with <2/10 LBP/LLE pain to promote return to PLOF   Baseline ROM: lumbar flexion - fingertips to distal tibia; pain limited, lumbar ext WNL; end range pain, lumbar lateral flexion R/L WNL, lumbar rotation R WNL, lumbar rotation L mild limitation secondary to onset of 4/10 LBP   Time 4   Period Weeks   Status New     PT LONG TERM GOAL #4   Title Pt will be able to pick up a 5lb object with <3/10 LBP/LLE pain so that she can perform ADLs within low pain tolerance   Baseline pt reporting lifting objects as a major aggravating factor for onset of severe LBP   Time 4   Period Weeks   Status New     PT LONG TERM GOAL #5   Title Pt will be able to tolerate a 7 hr work day with <3/10 LBP/LLE pain   Baseline pt with moments of "12/10" pain during work day, with incorporated use of standing desk   Time 4   Period Weeks   Status New               Plan - 08/09/16 1244    Clinical Impression Statement Pt arrives to session with mild reports of LBP; exercises do not exacerbate or resolve sx. She demonstrates difficulty with single leg stance on bilat LE; states intermittent episodes of foot scuffing/difficulty with foot clearance. Demonstrates early onset of fatigue with PF/DR/EV/IV esp L ankle.   Rehab Potential Fair   Clinical Impairments Affecting Rehab Potential Negative: Chronic, longterm post surgical   PT Frequency 2x / week   PT Duration 4 weeks   PT Treatment/Interventions ADLs/Self Care Home Management;Aquatic Therapy;Biofeedback;Cryotherapy;Electrical Stimulation;Moist Heat;Ultrasound;Gait training;Stair training;Functional mobility training;Therapeutic activities;Therapeutic exercise;Balance training;Neuromuscular re-education;Patient/family education;Manual techniques;Passive range of motion;Dry needling   PT Next Visit Plan progressive  mobility program, LE strengthening of proximal musculature   PT Home Exercise Plan see pt instructions   Consulted and Agree with Plan of Care Patient      Patient will benefit from skilled therapeutic intervention in order to improve the following deficits and impairments:  Abnormal gait, Decreased activity tolerance, Decreased balance, Decreased coordination, Decreased endurance, Decreased mobility, Decreased range of motion, Decreased strength, Difficulty walking, Hypomobility, Increased muscle spasms, Impaired flexibility, Improper body mechanics, Postural dysfunction, Pain  Visit Diagnosis: Abnormal posture  Difficulty in walking, not elsewhere classified  Left-sided low back pain with left-sided sciatica     Problem List There are no active problems to display for this patient.  Cammie Mcgee, PT, DPT # [id number] Vernona Rieger Charman Blasco SPT 08/09/2016, 2:19 PM  [facility] [address] [place],  [place], [id number] Phone: [phone]   Fax:  [phone]  Name: Rosary LivelySherry R Seevers MRN: [id number] Date of Birth: 03/05/1951

## 2016-08-12 ENCOUNTER — Ambulatory Visit: Payer: BLUE CROSS/BLUE SHIELD | Admitting: Physical Therapy

## 2016-08-12 ENCOUNTER — Encounter: Payer: Self-pay | Admitting: Physical Therapy

## 2016-08-12 DIAGNOSIS — R262 Difficulty in walking, not elsewhere classified: Secondary | ICD-10-CM

## 2016-08-12 DIAGNOSIS — M5442 Lumbago with sciatica, left side: Secondary | ICD-10-CM

## 2016-08-12 DIAGNOSIS — R293 Abnormal posture: Secondary | ICD-10-CM

## 2016-08-12 NOTE — Therapy (Signed)
[facility] [address] [place], [place], [address] Phone: [phone]   Fax:  [phone]  Physical Therapy Treatment  Patient Details  Name: Amanda LivelySherry R Virrueta MRN: [id number] Date of Birth: 06/27/1951 Referring Provider: Stefani DamaHenry J Elsner MD  Encounter Date: 08/12/2016      PT End of Session - 08/12/16 1541    Visit Number 4   Number of Visits 8   Date for PT Re-Evaluation 08/26/16   PT Start Time 0900   PT Stop Time 0949   PT Time Calculation (min) 49 min   Activity Tolerance Patient tolerated treatment well   Behavior During Therapy [facility] for tasks assessed/performed      Past Medical History:  Diagnosis Date  . Anxiety   . Arthritis   . GERD (gastroesophageal reflux disease)   . Hypertension    Takes propranolol for HTN Does not see a cardiologist  . Migraine headache   . Restless leg syndrome   . Vitamin D deficiency     Past Surgical History:  Procedure Laterality Date  . APPENDECTOMY     done as part of tubal ligation  . TUBAL LIGATION      There were no vitals filed for this visit.      Subjective Assessment - 08/12/16 1540    Subjective Pt states that she has noticed some UE/pec soreness due to "open book" therapeutic exercise. Overall states that HEP program is going well and she is performing regular. States that since onset of physical therapy she has had no more onsets of severe >5/10 back/leg pain.   Pertinent History 02/24/16: L4-L5 fusion.    Limitations Sitting;Lifting;Standing;Walking;House hold activities   Diagnostic tests MRI 04/08/16: L5-S1 L moderate disc protrusion with mass effect on L intraspinal S1 nerve root, L3--L4 mild disc bulge, moderate right facet arthropathy and right lateral recess stenosis, mild spinal stenosis   Patient Stated Goals pt would like to be able to perform regular exercise (walk and grandchilden ball games) without back pain   Currently in Pain? No/denies      Objective:  Therapeutic Exercise:Treadmill 1.0 mph 10 minutes (warm up/no charge). Standing hamstring stretch 1 minute x1. Lumbar flexion with green theraball with L opening 15 sec holds x6. Child's pose 20 sec x5 - cueing for positioning for left opening. Cat/camel x10. TrA with heel slide x30, marching x30, beginning bike 1 minute x2.   Neuromuscular Re-ed: Tandem walk 2012ft x4 forward/backward. Single leg stance with anterior/posterior touch.  Pt response for medical necessity: Pt with mild increase in LBP following 10 minutes of treadmill walking but states she is in no pain by end of treatment session. Demonstrates good understanding of core progression exercises and will begin implementing increased difficulty exercises into home program.       PT Education - 08/12/16 1541    Education provided Yes   Education Details Page 2 of TrA/core progression program   Person(s) Educated Patient   Methods Explanation;Demonstration;Handout   Comprehension Verbalized understanding;Returned demonstration             PT Long Term Goals - 08/02/16 1422      PT LONG TERM GOAL #1   Title Pt will score <20% on MODI to promote improvement of percieved functional mobility   Baseline 08/02/16: MODI 38% moderate self percieved disability   Time 4   Period Weeks   Status New     PT LONG TERM GOAL #2   Title Pt will  demonstrate increase by 1/2 muscle grade per MMT of LLE affected musculature to promote increase in functional mobility/stability   Baseline MMT LE R/L hip flexion 4+/4+, knee extension 5/5, knee flexion 5/4, dorsiflexion 5/4, plantarflexion 5/4-   Time 4   Period Weeks   Status New     PT LONG TERM GOAL #3   Title Pt will be able to tolerate lumbar ROM all planes with <2/10 LBP/LLE pain to promote return to PLOF   Baseline ROM: lumbar flexion - fingertips to distal tibia; pain limited, lumbar ext WNL; end range pain, lumbar lateral flexion R/L WNL, lumbar rotation R WNL, lumbar  rotation L mild limitation secondary to onset of 4/10 LBP   Time 4   Period Weeks   Status New     PT LONG TERM GOAL #4   Title Pt will be able to pick up a 5lb object with <3/10 LBP/LLE pain so that she can perform ADLs within low pain tolerance   Baseline pt reporting lifting objects as a major aggravating factor for onset of severe LBP   Time 4   Period Weeks   Status New     PT LONG TERM GOAL #5   Title Pt will be able to tolerate a 7 hr work day with <3/10 LBP/LLE pain   Baseline pt with moments of "12/10" pain during work day, with incorporated use of standing desk   Time 4   Period Weeks   Status New               Plan - 08/12/16 1543    Clinical Impression Statement Pt with onset of low level LBP 3/10 with 2 minutes of treadmill walking but notes overall decrease in LBP sx following therapy session. She demonstrates good understanding of TrA activation and is able to perform exercises of mild difficulty with no abdominal bracing/breath holding. Pt with early onset of fatigue in lower abdominals when performing beginning bike exercise. Positive response to cat/camel and child's pose with L opening; able to replicate lumbar stretch using green theraball.    Rehab Potential Fair   Clinical Impairments Affecting Rehab Potential Negative: Chronic, longterm post surgical   PT Frequency 2x / week   PT Duration 4 weeks   PT Treatment/Interventions ADLs/Self Care Home Management;Aquatic Therapy;Biofeedback;Cryotherapy;Electrical Stimulation;Moist Heat;Ultrasound;Gait training;Stair training;Functional mobility training;Therapeutic activities;Therapeutic exercise;Balance training;Neuromuscular re-education;Patient/family education;Manual techniques;Passive range of motion;Dry needling   PT Next Visit Plan progressive mobility program, LE strengthening of proximal musculature, neuromuscular re-ed   PT Home Exercise Plan see pt instructions   Consulted and Agree with Plan of Care  Patient      Patient will benefit from skilled therapeutic intervention in order to improve the following deficits and impairments:  Abnormal gait, Decreased activity tolerance, Decreased balance, Decreased coordination, Decreased endurance, Decreased mobility, Decreased range of motion, Decreased strength, Difficulty walking, Hypomobility, Increased muscle spasms, Impaired flexibility, Improper body mechanics, Postural dysfunction, Pain  Visit Diagnosis: Abnormal posture  Difficulty in walking, not elsewhere classified  Left-sided low back pain with left-sided sciatica     Problem List There are no active problems to display for this patient.  Cammie Mcgee, PT, DPT # [id number] Vernona Rieger Kodie Pick SPT 08/12/2016, 3:48 PM  [facility] [address] [place], [place], [address] Phone: [phone]   Fax:  [phone]  Name: CALEAH TORTORELLI MRN: [id number] Date of Birth: [birth year]

## 2016-08-16 ENCOUNTER — Encounter: Payer: Self-pay | Admitting: Physical Therapy

## 2016-08-16 ENCOUNTER — Ambulatory Visit: Payer: BLUE CROSS/BLUE SHIELD | Attending: Neurological Surgery | Admitting: Physical Therapy

## 2016-08-16 DIAGNOSIS — R293 Abnormal posture: Secondary | ICD-10-CM | POA: Diagnosis not present

## 2016-08-16 DIAGNOSIS — M5442 Lumbago with sciatica, left side: Secondary | ICD-10-CM | POA: Insufficient documentation

## 2016-08-16 DIAGNOSIS — R262 Difficulty in walking, not elsewhere classified: Secondary | ICD-10-CM | POA: Insufficient documentation

## 2016-08-16 DIAGNOSIS — G8929 Other chronic pain: Secondary | ICD-10-CM

## 2016-08-16 NOTE — Therapy (Signed)
[name] [address] [place], [place], [address] Phone: [phone]   Fax:  [phone]  Physical Therapy Treatment  Patient Details  Name: Amanda LivelySherry R Alberta MRN: [id number] Date of Birth: 06/07/1951 Referring Provider: Stefani DamaHenry J Elsner MD  Encounter Date: 08/16/2016      PT End of Session - 08/16/16 0907    Visit Number 5   Number of Visits 8   Date for PT Re-Evaluation 08/26/16   PT Start Time 0900   PT Stop Time 0948   PT Time Calculation (min) 48 min   Activity Tolerance Patient tolerated treatment well   Behavior During Therapy [facility] for tasks assessed/performed      Past Medical History:  Diagnosis Date  . Anxiety   . Arthritis   . GERD (gastroesophageal reflux disease)   . Hypertension    Takes propranolol for HTN Does not see a cardiologist  . Migraine headache   . Restless leg syndrome   . Vitamin D deficiency     Past Surgical History:  Procedure Laterality Date  . APPENDECTOMY     done as part of tubal ligation  . TUBAL LIGATION      There were no vitals filed for this visit.      Subjective Assessment - 08/16/16 0902    Subjective Pt states that she aggravated her back pain on Sunday and could not relieve it all day. States the only aggravant she can think of was early cooking duties that morning. States that occasionally positioning exercises help relieve LBP but nothing worked this weekend. Tried icy/hot and noticed minimal relief.    Pertinent History 02/24/16: L4-L5 fusion.    Limitations Sitting;Lifting;Standing;Walking;House hold activities   Diagnostic tests MRI 04/08/16: L5-S1 L moderate disc protrusion with mass effect on L intraspinal S1 nerve root, L3--L4 mild disc bulge, moderate right facet arthropathy and right lateral recess stenosis, mild spinal stenosis   Patient Stated Goals pt would like to be able to perform regular exercise (walk and grandchilden ball games) without back pain   Currently in Pain? No/denies      Objective:   Therapeutic Exercise: SciFit 10 minutes Level 6.0 (warm up/no charge). On green theraball: lumbar flexion with L opening 20 sec holds, pelvic tilts x10, marching with reciprocal hip/shoulder flexion x30, knee extension with reciprocal shoulder flexion x30.    Neuromuscular Re-ed: In // bars: tandem walk 8112ft x4 forward/backward, single leg stance on blue air ex pad 30sec-1 minute trials in SLS R/L x6, narrow BOS on blue air ex pad x1 minute, narrow BOS on blue air ex pad with weighted ball #2 lumbar rotations x30, diagonals x15 ea.     Pt response for medical necessity: Pt with no c/o of LBP/leg pain this session. She demonstrates more difficulty with R SLS than L SLS. She is able to achieve equivalent difficulty of balance tasks on RLE/LLE but requires multiple trials. Progressing well with LE strengthening/balance tasks to improve gait stability and limit low back involvement.      PT Long Term Goals - 08/02/16 1422      PT LONG TERM GOAL #1   Title Pt will score <20% on MODI to promote improvement of percieved functional mobility   Baseline 08/02/16: MODI 38% moderate self percieved disability   Time 4   Period Weeks   Status New     PT LONG TERM GOAL #2   Title Pt will demonstrate increase by 1/2 muscle grade per MMT  of LLE affected musculature to promote increase in functional mobility/stability   Baseline MMT LE R/L hip flexion 4+/4+, knee extension 5/5, knee flexion 5/4, dorsiflexion 5/4, plantarflexion 5/4-   Time 4   Period Weeks   Status New     PT LONG TERM GOAL #3   Title Pt will be able to tolerate lumbar ROM all planes with <2/10 LBP/LLE pain to promote return to PLOF   Baseline ROM: lumbar flexion - fingertips to distal tibia; pain limited, lumbar ext WNL; end range pain, lumbar lateral flexion R/L WNL, lumbar rotation R WNL, lumbar rotation L mild limitation secondary to onset of 4/10 LBP   Time 4   Period Weeks   Status  New     PT LONG TERM GOAL #4   Title Pt will be able to pick up a 5lb object with <3/10 LBP/LLE pain so that she can perform ADLs within low pain tolerance   Baseline pt reporting lifting objects as a major aggravating factor for onset of severe LBP   Time 4   Period Weeks   Status New     PT LONG TERM GOAL #5   Title Pt will be able to tolerate a 7 hr work day with <3/10 LBP/LLE pain   Baseline pt with moments of "12/10" pain during work day, with incorporated use of standing desk   Time 4   Period Weeks   Status New            Plan - 08/16/16 0949    Clinical Impression Statement Pt able to tolerate moderate intensity exercise on Scifit with no c/o of back pain; in comparison to consistent response of LBP following treadmill ambulation. Pt with no c/o of LBP/leg pain this session with multiple standing/sitting balance/stability tasks. Pt with better tolerance of single leg balance on unstable surface with R than L; able to achieve SLS for >45 sec bilaterally after multiple trials.   Rehab Potential Fair   Clinical Impairments Affecting Rehab Potential Negative: Chronic, longterm post surgical   PT Frequency 2x / week   PT Duration 4 weeks   PT Treatment/Interventions ADLs/Self Care Home Management;Aquatic Therapy;Biofeedback;Cryotherapy;Electrical Stimulation;Moist Heat;Ultrasound;Gait training;Stair training;Functional mobility training;Therapeutic activities;Therapeutic exercise;Balance training;Neuromuscular re-education;Patient/family education;Manual techniques;Passive range of motion;Dry needling   PT Next Visit Plan progressive mobility program, LE strengthening of proximal musculature, neuromuscular re-ed   PT Home Exercise Plan see pt instructions   Consulted and Agree with Plan of Care Patient      Patient will benefit from skilled therapeutic intervention in order to improve the following deficits and impairments:  Abnormal gait, Decreased activity tolerance, Decreased  balance, Decreased coordination, Decreased endurance, Decreased mobility, Decreased range of motion, Decreased strength, Difficulty walking, Hypomobility, Increased muscle spasms, Impaired flexibility, Improper body mechanics, Postural dysfunction, Pain  Visit Diagnosis: Abnormal posture  Difficulty in walking, not elsewhere classified  Chronic left-sided low back pain with left-sided sciatica     Problem List There are no active problems to display for this patient.  Cammie Mcgee, PT, DPT # [phone] Vernona Rieger Pallavi Clifton SPT 08/16/2016, 10:05 AM  [facility] [address] [place], [place], [address] Phone: [phone]   Fax:  [phone]  Name: SIGOURNEY PORTILLO MRN: [id number] Date of Birth: [birth year]

## 2016-08-19 ENCOUNTER — Encounter: Payer: Self-pay | Admitting: Physical Therapy

## 2016-08-19 ENCOUNTER — Ambulatory Visit: Payer: BLUE CROSS/BLUE SHIELD | Admitting: Physical Therapy

## 2016-08-19 DIAGNOSIS — R262 Difficulty in walking, not elsewhere classified: Secondary | ICD-10-CM

## 2016-08-19 DIAGNOSIS — G8929 Other chronic pain: Secondary | ICD-10-CM

## 2016-08-19 DIAGNOSIS — M5442 Lumbago with sciatica, left side: Secondary | ICD-10-CM

## 2016-08-19 DIAGNOSIS — R293 Abnormal posture: Secondary | ICD-10-CM

## 2016-08-19 NOTE — Therapy (Signed)
[facility] [address] [place], [place], [address] Phone: [phone]   Fax:  [phone]  Physical Therapy Treatment  Patient Details  Name: Amanda Brooks MRN: [id number] Date of Birth: Apr 29, 1951 Referring Provider: Stefani Dama MD  Encounter Date: 08/19/2016      PT End of Session - 08/19/16 1248    Visit Number 6   Number of Visits 8   Date for PT Re-Evaluation 08/26/16   PT Start Time 0900   PT Stop Time 0948   PT Time Calculation (min) 48 min   Activity Tolerance Patient tolerated treatment well   Behavior During Therapy [facility] for tasks assessed/performed      Past Medical History:  Diagnosis Date  . Anxiety   . Arthritis   . GERD (gastroesophageal reflux disease)   . Hypertension    Takes propranolol for HTN Does not see a cardiologist  . Migraine headache   . Restless leg syndrome   . Vitamin D deficiency     Past Surgical History:  Procedure Laterality Date  . APPENDECTOMY     done as part of tubal ligation  . TUBAL LIGATION      There were no vitals filed for this visit.      Subjective Assessment - 08/19/16 1247    Subjective Pt states that she is doing well, reports no pain at time of tx. States that she experiences onset of LBP when sitting in the bleachers at her grandson's football game. She is looking into purchase of cushion with attached lumbar support.    Pertinent History 02/24/16: L4-L5 fusion.    Limitations Sitting;Lifting;Standing;Walking;House hold activities   Diagnostic tests MRI 04/08/16: L5-S1 L moderate disc protrusion with mass effect on L intraspinal S1 nerve root, L3--L4 mild disc bulge, moderate right facet arthropathy and right lateral recess stenosis, mild spinal stenosis   Patient Stated Goals pt would like to be able to perform regular exercise (walk and grandchilden ball games) without back pain   Currently in Pain? No/denies      Objective:  Therapeutic Exercise: TrA  activation exercises: SLR x15 R/L with lat act, bridge x15, supine beginner bike 30 sec x2 R/L, bridge with marching x15 R/L. Advanced bike 3x 1 minute duration. TG squat with TrA activation x30, x15 press up/shoulder flexion. Standing hamstring stretch 1 minute R/L. Standing quad stretch 1 minute R/L.  Pt response for medical necessity: Pt with no c/o of LBP this session. She tolerates higher degree of difficulty with onset of fatigue but no pain.      PT Education - 08/19/16 1247    Education provided Yes   Education Details Progression of TrA exercise. Pt performing bridge with marching and advanced bike at this time.   Person(s) Educated Patient   Methods Explanation;Demonstration;Handout   Comprehension Verbalized understanding;Returned demonstration             PT Long Term Goals - 08/02/16 1422      PT LONG TERM GOAL #1   Title Pt will score <20% on MODI to promote improvement of percieved functional mobility   Baseline 08/02/16: MODI 38% moderate self percieved disability   Time 4   Period Weeks   Status New     PT LONG TERM GOAL #2   Title Pt will demonstrate increase by 1/2 muscle grade per MMT of LLE affected musculature to promote increase in functional mobility/stability   Baseline MMT LE R/L hip flexion 4+/4+, knee extension  5/5, knee flexion 5/4, dorsiflexion 5/4, plantarflexion 5/4-   Time 4   Period Weeks   Status New     PT LONG TERM GOAL #3   Title Pt will be able to tolerate lumbar ROM all planes with <2/10 LBP/LLE pain to promote return to PLOF   Baseline ROM: lumbar flexion - fingertips to distal tibia; pain limited, lumbar ext WNL; end range pain, lumbar lateral flexion R/L WNL, lumbar rotation R WNL, lumbar rotation L mild limitation secondary to onset of 4/10 LBP   Time 4   Period Weeks   Status New     PT LONG TERM GOAL #4   Title Pt will be able to pick up a 5lb object with <3/10 LBP/LLE pain so that she can perform ADLs within low pain tolerance    Baseline pt reporting lifting objects as a major aggravating factor for onset of severe LBP   Time 4   Period Weeks   Status New     PT LONG TERM GOAL #5   Title Pt will be able to tolerate a 7 hr work day with <3/10 LBP/LLE pain   Baseline pt with moments of "12/10" pain during work day, with incorporated use of standing desk   Time 4   Period Weeks   Status New               Plan - 08/19/16 1248    Clinical Impression Statement Session emphasis on advancement of core stability program. Pt able to tolerate exercises up to advanced bike at this time for short duration before onset of fatigue. She demonstrates difficulty with maintaining bridge position in single leg stance. Pt LBP is not aggravated by higher intensity of exercise/   Rehab Potential Fair   Clinical Impairments Affecting Rehab Potential Negative: Chronic, longterm post surgical   PT Frequency 2x / week   PT Duration 4 weeks   PT Treatment/Interventions ADLs/Self Care Home Management;Aquatic Therapy;Biofeedback;Cryotherapy;Electrical Stimulation;Moist Heat;Ultrasound;Gait training;Stair training;Functional mobility training;Therapeutic activities;Therapeutic exercise;Balance training;Neuromuscular re-education;Patient/family education;Manual techniques;Passive range of motion;Dry needling   PT Next Visit Plan progressive mobility program, LE strengthening of proximal musculature, neuromuscular re-ed   PT Home Exercise Plan see pt instructions   Consulted and Agree with Plan of Care Patient      Patient will benefit from skilled therapeutic intervention in order to improve the following deficits and impairments:  Abnormal gait, Decreased activity tolerance, Decreased balance, Decreased coordination, Decreased endurance, Decreased mobility, Decreased range of motion, Decreased strength, Difficulty walking, Hypomobility, Increased muscle spasms, Impaired flexibility, Improper body mechanics, Postural dysfunction,  Pain  Visit Diagnosis: Abnormal posture  Difficulty in walking, not elsewhere classified  Chronic left-sided low back pain with left-sided sciatica     Problem List There are no active problems to display for this patient.  Cammie McgeeMichael C Sherk, PT, DPT # [phone] Vernona RiegerLaura Calvin Jablonowski SPT 08/19/2016, 12:52 PM  [place] [facility] [address] [place], [place], [address] Phone: [phone]   Fax:  [phone]  Name: Amanda LivelySherry R Brooks MRN: [id number] Date of Birth: 03/28/1951

## 2016-08-23 ENCOUNTER — Encounter: Payer: Self-pay | Admitting: Physical Therapy

## 2016-08-23 ENCOUNTER — Ambulatory Visit: Payer: BLUE CROSS/BLUE SHIELD | Admitting: Physical Therapy

## 2016-08-23 DIAGNOSIS — M5442 Lumbago with sciatica, left side: Secondary | ICD-10-CM

## 2016-08-23 DIAGNOSIS — R293 Abnormal posture: Secondary | ICD-10-CM | POA: Diagnosis not present

## 2016-08-23 DIAGNOSIS — R262 Difficulty in walking, not elsewhere classified: Secondary | ICD-10-CM

## 2016-08-23 DIAGNOSIS — G8929 Other chronic pain: Secondary | ICD-10-CM

## 2016-08-23 NOTE — Therapy (Signed)
[facility] [address] [place], [place], [address] Phone: [phone]   Fax:  [phone]  Physical Therapy Treatment  Patient Details  Name: Amanda Brooks MRN: [id number] Date of Birth: 02/22/1951 Referring Provider: Stefani Dama MD  Encounter Date: 08/23/2016      PT End of Session - 08/23/16 1231    Visit Number 7   Number of Visits 8   Date for PT Re-Evaluation 08/26/16   PT Start Time 0900   PT Stop Time 0948   PT Time Calculation (min) 48 min   Activity Tolerance Patient tolerated treatment well   Behavior During Therapy [facility] for tasks assessed/performed      Past Medical History:  Diagnosis Date  . Anxiety   . Arthritis   . GERD (gastroesophageal reflux disease)   . Hypertension    Takes propranolol for HTN Does not see a cardiologist  . Migraine headache   . Restless leg syndrome   . Vitamin D deficiency     Past Surgical History:  Procedure Laterality Date  . APPENDECTOMY     done as part of tubal ligation  . TUBAL LIGATION      There were no vitals filed for this visit.      Subjective Assessment - 08/23/16 1229    Subjective Pt states that she had no episodes of low back pain at all last weekend and is feeling optimistic. States that she will meet with her doctor on 10/18 to discuss her progress. Pt arrived to PT appt with head/neck pain secondary to migraine that she reports started late last night.   Pertinent History 02/24/16: L4-L5 fusion.    Limitations Sitting;Lifting;Standing;Walking;House hold activities   Diagnostic tests MRI 04/08/16: L5-S1 L moderate disc protrusion with mass effect on L intraspinal S1 nerve root, L3--L4 mild disc bulge, moderate right facet arthropathy and right lateral recess stenosis, mild spinal stenosis   Patient Stated Goals pt would like to be able to perform regular exercise (walk and grandchilden ball games) without back pain   Currently in Pain? No/denies       Objective:  Therapeutic Exercise: SciFit Level 8 8 minutes (warm up/no charge). Seated on GTB: lateral flexion 5 sec end range hold x10 R/L, seated rows with RTB x30 with TrA contraction, knee extension with single leg stability x30 R/L, hip flexion with contralateral UE flexion x30 R/L, hip add x20 10 sec holds with TrA contraction, hip abd x20 10 sec holds with RTB with TrA contraction. Seated thoracic extension x10.  Pt response for medical necessity: Pt arrives to PT session with no c/o of low back pain and reports no aggravation of sx. States thoracic ext exercise "feels good" and she will incorporate into her daily routine. Pt is progressing well towards pain free mobility.       PT Long Term Goals - 08/02/16 1422      PT LONG TERM GOAL #1   Title Pt will score <20% on MODI to promote improvement of percieved functional mobility   Baseline 08/02/16: MODI 38% moderate self percieved disability   Time 4   Period Weeks   Status New     PT LONG TERM GOAL #2   Title Pt will demonstrate increase by 1/2 muscle grade per MMT of LLE affected musculature to promote increase in functional mobility/stability   Baseline MMT LE R/L hip flexion 4+/4+, knee extension 5/5, knee flexion 5/4, dorsiflexion 5/4, plantarflexion 5/4-   Time 4  Period Weeks   Status New     PT LONG TERM GOAL #3   Title Pt will be able to tolerate lumbar ROM all planes with <2/10 LBP/LLE pain to promote return to PLOF   Baseline ROM: lumbar flexion - fingertips to distal tibia; pain limited, lumbar ext WNL; end range pain, lumbar lateral flexion R/L WNL, lumbar rotation R WNL, lumbar rotation L mild limitation secondary to onset of 4/10 LBP   Time 4   Period Weeks   Status New     PT LONG TERM GOAL #4   Title Pt will be able to pick up a 5lb object with <3/10 LBP/LLE pain so that she can perform ADLs within low pain tolerance   Baseline pt reporting lifting objects as a major aggravating factor for onset of severe LBP    Time 4   Period Weeks   Status New     PT LONG TERM GOAL #5   Title Pt will be able to tolerate a 7 hr work day with <3/10 LBP/LLE pain   Baseline pt with moments of "12/10" pain during work day, with incorporated use of standing desk   Time 4   Period Weeks   Status New               Plan - 08/23/16 1232    Clinical Impression Statement Pt continues to progress well through lumbar mobility/core stability program. She tolerates sustained lumbar flexion seated on theraball with no c/o. Pt with early onset of fatigue with incorporation of UE exercise with core activation. Demonstrates positive response to repeated thoracic ext in sitting.    Rehab Potential Fair   Clinical Impairments Affecting Rehab Potential Negative: Chronic, longterm post surgical   PT Frequency 2x / week   PT Duration 4 weeks   PT Treatment/Interventions ADLs/Self Care Home Management;Aquatic Therapy;Biofeedback;Cryotherapy;Electrical Stimulation;Moist Heat;Ultrasound;Gait training;Stair training;Functional mobility training;Therapeutic activities;Therapeutic exercise;Balance training;Neuromuscular re-education;Patient/family education;Manual techniques;Passive range of motion;Dry needling   PT Next Visit Plan progressive mobility program, LE strengthening of proximal musculature, neuromuscular re-ed   PT Home Exercise Plan see pt instructions   Consulted and Agree with Plan of Care Patient      Patient will benefit from skilled therapeutic intervention in order to improve the following deficits and impairments:  Abnormal gait, Decreased activity tolerance, Decreased balance, Decreased coordination, Decreased endurance, Decreased mobility, Decreased range of motion, Decreased strength, Difficulty walking, Hypomobility, Increased muscle spasms, Impaired flexibility, Improper body mechanics, Postural dysfunction, Pain  Visit Diagnosis: Abnormal posture  Difficulty in walking, not elsewhere  classified  Chronic left-sided low back pain with left-sided sciatica     Problem List There are no active problems to display for this patient.  Cammie McgeeMichael C Sherk, PT, DPT # [id number] Vernona RiegerLaura Khaniyah Bezek SPT 08/23/2016, 12:36 PM  [facility] [address] [place], [place], [address] Phone: [phone]   Fax:  [phone]  Name: Amanda Brooks MRN: [id number] Date of Birth: 11/09/1951

## 2016-08-26 ENCOUNTER — Encounter: Payer: Self-pay | Admitting: Physical Therapy

## 2016-08-26 ENCOUNTER — Ambulatory Visit: Payer: BLUE CROSS/BLUE SHIELD | Admitting: Physical Therapy

## 2016-08-26 DIAGNOSIS — M5442 Lumbago with sciatica, left side: Secondary | ICD-10-CM

## 2016-08-26 DIAGNOSIS — R293 Abnormal posture: Secondary | ICD-10-CM | POA: Diagnosis not present

## 2016-08-26 DIAGNOSIS — R262 Difficulty in walking, not elsewhere classified: Secondary | ICD-10-CM

## 2016-08-26 DIAGNOSIS — G8929 Other chronic pain: Secondary | ICD-10-CM

## 2016-08-26 NOTE — Therapy (Signed)
[facility] [address] [place], [place], [address] Phone: [phone]   Fax:  [phone]  Physical Therapy Treatment  Patient Details  Name: Amanda Brooks MRN: [id number] Date of Birth: 09/19/1951 Referring Provider: Earleen Newport MD  Encounter Date: 08/26/2016      PT End of Session - 08/26/16 1611    Visit Number 8   Number of Visits 15   Date for PT Re-Evaluation 09/23/16   PT Start Time 0858   PT Stop Time 0947   PT Time Calculation (min) 49 min   Activity Tolerance Patient tolerated treatment well   Behavior During Therapy [facility] for tasks assessed/performed      Past Medical History:  Diagnosis Date  . Anxiety   . Arthritis   . GERD (gastroesophageal reflux disease)   . Hypertension    Takes propranolol for HTN Does not see a cardiologist  . Migraine headache   . Restless leg syndrome   . Vitamin D deficiency     Past Surgical History:  Procedure Laterality Date  . APPENDECTOMY     done as part of tubal ligation  . TUBAL LIGATION      There were no vitals filed for this visit.      Subjective Assessment - 08/26/16 1603    Subjective Pt states that she experienced a bout of low back pain starting yesterday; it has been low grade pain but she is discouraged by return of pain following pain free weekend. Pt would like to continue with physical therapy to decrease her pain level/frequency of sx.    Pertinent History 02/24/16: L4-L5 fusion.    Limitations Sitting;Lifting;Standing;Walking;House hold activities   Diagnostic tests MRI 04/08/16: L5-S1 L moderate disc protrusion with mass effect on L intraspinal S1 nerve root, L3--L4 mild disc bulge, moderate right facet arthropathy and right lateral recess stenosis, mild spinal stenosis   Patient Stated Goals pt would like to be able to perform regular exercise (walk and grandchilden ball games) without back pain   Currently in Pain? Yes   Pain Score 2    Pain  Location Back   Pain Orientation Left   Pain Descriptors / Indicators Constant   Pain Type Chronic pain   Pain Onset More than a month ago      Objective:   Therapeutic Exercise: SciFit Level 8 8 minutes (warm up/no charge). Bridge 3x10. Advanced bike 1 minute x4. Lumbar AROM all planes x4; pt with c/o of left sided pain with right rotation. Hip extension in prone 3 sec hold x10 R/L; pt with tendency to arch her back without cueing. Glute squeeze 10 sec holds x10. Standing hip extension with SLR x10.   Neuromuscular Re-ed: Single leg balance trials R/L x8 - pt able to achieve >25 with LLE consistently and <13 with RLE consistently.   Pt response for medical necessity: Pt arrives to PT session with 2/10 LBP; she does not experience an increase in pain with therapeutic exercises but pain does not abate during session. Pt has demonstrated steady progress with decrease of acutely painful episodes; and episodes of pain free mobility but continues to have low level pain especially with standing/ambulating tasks. She will benefit from continued skilled therapy to promote painless mobility.       PT Education - 08/26/16 1610    Education provided Yes   Education Details see pt instructions: glute activation exercises   Person(s) Educated Patient   Methods Explanation;Demonstration;Handout   Comprehension  Verbalized understanding;Returned demonstration             PT Long Term Goals - 08/26/16 1619      PT LONG TERM GOAL #1   Title Pt will score <20% on MODI to promote improvement of percieved functional mobility   Baseline 08/02/16: MODI 38% moderate self percieved disability. 10/12: 36% moderate self percieved disability   Time 4   Period Weeks   Status Partially Met     PT LONG TERM GOAL #2   Title Pt will demonstrate increase by 1/2 muscle grade per MMT of LLE affected musculature to promote increase in functional mobility/stability   Baseline MMT LE R/L hip flexion 4+/4+, knee  extension 5/5, knee flexion 5/4, dorsiflexion 5/4, plantarflexion 5/4-. 10/12: MMT hip extension 4- bilat, hip abd 4+ bilat   Time 4   Period Weeks   Status Partially Met     PT LONG TERM GOAL #3   Title Pt will be able to tolerate lumbar ROM all planes with <2/10 LBP/LLE pain to promote return to PLOF   Baseline ROM: lumbar flexion - fingertips to distal tibia; pain limited, lumbar ext WNL; end range pain, lumbar lateral flexion R/L WNL, lumbar rotation R WNL, lumbar rotation L mild limitation secondary to onset of 4/10 LBP. 10/12: pt with no c/o for all planes except left sided pain with R lumbar rotation.    Time 4   Period Weeks   Status Partially Met     PT LONG TERM GOAL #4   Title Pt will be able to pick up a 5lb object with <3/10 LBP/LLE pain so that she can perform ADLs within low pain tolerance   Baseline pt reporting lifting objects as a major aggravating factor for onset of severe LBP   Time 4   Period Weeks   Status Partially Met     PT LONG TERM GOAL #5   Title Pt will be able to tolerate a 7 hr work day with <3/10 LBP/LLE pain   Baseline pt with moments of "12/10" pain during work day, with incorporated use of standing desk   Time 4   Period Weeks   Status Achieved               Plan - 08/26/16 1612    Clinical Impression Statement Pt has progressed well with physical therapy program over the past 4 weeks. She has demonstrated a decrease in intensity of low back/leg pain and decrease in frequency of acutely painful episodes. Pt is able to perform lumbar AROM all planes with no complaints except L sided pain with R lumbar rotation. She experiences no consistent aggravating factor for onset of LBP but notes that she has more difficulty with prolonged standing/walking tasks than work/sitting related duties. Pt demonstrates functional strength of LE musculature but with weakness of bilateral hip extensors. She also demonstrates difficulty with single leg stance; able to  sustain SLS >25 sec on LLE, <13 sec on RLE consistently. At this time, pt would benefit from continued physical therapy to address continued c/o of low back pain, strengthening of weakened posterior chain musculature/core stability and single leg stance to promote equal weight bearing/lumbar stress.    Rehab Potential Fair   Clinical Impairments Affecting Rehab Potential Negative: Chronic, longterm post surgical   PT Frequency 2x / week   PT Duration 4 weeks   PT Treatment/Interventions ADLs/Self Care Home Management;Aquatic Therapy;Biofeedback;Cryotherapy;Electrical Stimulation;Moist Heat;Ultrasound;Gait training;Stair training;Functional mobility training;Therapeutic activities;Therapeutic exercise;Balance training;Neuromuscular re-education;Patient/family education;Manual techniques;Passive range  of motion;Dry needling   PT Next Visit Plan progressive mobility program, LE strengthening of proximal musculature, neuromuscular re-ed   PT Home Exercise Plan see pt instructions   Consulted and Agree with Plan of Care Patient      Patient will benefit from skilled therapeutic intervention in order to improve the following deficits and impairments:  Abnormal gait, Decreased activity tolerance, Decreased balance, Decreased coordination, Decreased endurance, Decreased mobility, Decreased range of motion, Decreased strength, Difficulty walking, Hypomobility, Increased muscle spasms, Impaired flexibility, Improper body mechanics, Postural dysfunction, Pain  Visit Diagnosis: Abnormal posture  Difficulty in walking, not elsewhere classified  Chronic left-sided low back pain with left-sided sciatica     Problem List There are no active problems to display for this patient.  Pura Spice, PT, DPT # [id number] Derrill Memo, SPT 08/27/2016, 1:39 PM  [place] [facility] [address] [place], [place], [address] Phone: [phone]   Fax:  [phone]  Name:  Amanda Brooks MRN: [id number] Date of Birth: August 08, 1951

## 2016-09-02 ENCOUNTER — Encounter: Payer: BLUE CROSS/BLUE SHIELD | Admitting: Physical Therapy

## 2016-09-03 ENCOUNTER — Other Ambulatory Visit: Payer: Self-pay | Admitting: Neurological Surgery

## 2016-09-03 DIAGNOSIS — M5416 Radiculopathy, lumbar region: Secondary | ICD-10-CM

## 2016-09-15 ENCOUNTER — Ambulatory Visit
Admission: RE | Admit: 2016-09-15 | Discharge: 2016-09-15 | Disposition: A | Discharge status: Home / Self Care | Payer: BLUE CROSS/BLUE SHIELD | Source: Ambulatory Visit | Attending: Neurological Surgery | Admitting: Neurological Surgery

## 2016-09-15 DIAGNOSIS — M5116 Intervertebral disc disorders with radiculopathy, lumbar region: Secondary | ICD-10-CM | POA: Diagnosis not present

## 2016-09-15 DIAGNOSIS — M5416 Radiculopathy, lumbar region: Secondary | ICD-10-CM | POA: Insufficient documentation

## 2016-09-15 DIAGNOSIS — Z9889 Other specified postprocedural states: Secondary | ICD-10-CM | POA: Diagnosis not present

## 2016-09-15 DIAGNOSIS — M48061 Spinal stenosis, lumbar region without neurogenic claudication: Secondary | ICD-10-CM | POA: Diagnosis not present

## 2016-09-15 LAB — POCT I-STAT CREATININE: Creatinine, Ser: 0.7 mg/dL (ref 0.44–1.00)

## 2016-09-15 MED ORDER — GADOBENATE DIMEGLUMINE 529 MG/ML IV SOLN
15.0000 mL | Freq: Once | INTRAVENOUS | Status: AC | PRN
  Administered 2016-09-15: 15 mL via INTRAVENOUS

## 2017-04-02 ENCOUNTER — Ambulatory Visit
Admission: EM | Admit: 2017-04-02 | Discharge: 2017-04-02 | Disposition: A | Discharge status: Home / Self Care | Payer: BLUE CROSS/BLUE SHIELD | Attending: Emergency Medicine | Admitting: Emergency Medicine

## 2017-04-02 DIAGNOSIS — Z760 Encounter for issue of repeat prescription: Secondary | ICD-10-CM | POA: Diagnosis not present

## 2017-04-02 DIAGNOSIS — G2581 Restless legs syndrome: Secondary | ICD-10-CM | POA: Diagnosis present

## 2017-04-02 MED ORDER — ROPINIROLE HCL 2 MG PO TABS: 2.0000 mg | ORAL_TABLET | Freq: Every day | ORAL | 0 refills | Status: AC

## 2017-04-02 NOTE — ED Triage Notes (Signed)
Onset 2 days couldn't get in touch with her PCP office wants some meds  Both legs

## 2017-04-02 NOTE — Discharge Instructions (Addendum)
We have refilled your requip today. Please keep follow up appt with PCP as scheduled on 04/14/17

## 2017-04-02 NOTE — ED Provider Notes (Signed)
CSN: [id number]     Arrival date & time 04/02/17  0857 History   First MD Initiated Contact with Patient 04/02/17 0919     Chief Complaint  Patient presents with  . restless leg syndrom   (Consider location/radiation/quality/duration/timing/severity/associated sxs/prior Treatment) 66 yr old female presents to UC with cc of "restless legs". Pt states she ran out of meds 2 days ago, unable to sleep, has follow up appt on 5/31 with PCP. Pt denies any other cc of s/s at present.    The history is provided by the patient. No language interpreter was used.    Past Medical History:  Diagnosis Date  . Anxiety   . Arthritis   . GERD (gastroesophageal reflux disease)   . Hypertension    Takes propranolol for HTN Does not see a cardiologist  . Migraine headache   . Restless leg syndrome   . Vitamin D deficiency    Past Surgical History:  Procedure Laterality Date  . APPENDECTOMY     done as part of tubal ligation  . TUBAL LIGATION     No family history on file. Social History  Substance Use Topics  . Smoking status: Never Smoker  . Smokeless tobacco: Never Used  . Alcohol use No   OB History    No data available     Review of Systems  Constitutional: Negative.   HENT: Negative.   Eyes: Negative.   Respiratory: Negative.   Cardiovascular: Negative.   Gastrointestinal: Negative.   Endocrine: Negative.   Genitourinary: Negative.   Musculoskeletal: Positive for myalgias.  Skin: Negative.   Allergic/Immunologic: Negative.   Neurological: Negative.   Hematological: Negative.   Psychiatric/Behavioral: Positive for sleep disturbance.  All other systems reviewed and are negative.   Allergies  Percocet [oxycodone-acetaminophen]  Home Medications   Prior to Admission medications   Medication Sig Start Date End Date Taking? Authorizing Provider  Cholecalciferol (VITAMIN D3) 2000 UNITS TABS Take 2,000 Units by mouth daily.   Yes [provider]  citalopram  (CELEXA) 40 MG tablet Take 40 mg by mouth daily.   Yes [provider]  cyclobenzaprine (FLEXERIL) 10 MG tablet Take 1 tablet (10 mg total) by mouth 3 (three) times daily as needed for muscle spasms. 02/25/13  Yes Barnett Abu, MD  meloxicam (MOBIC) 15 MG tablet Take 15 mg by mouth daily.   Yes [provider]  omeprazole (PRILOSEC) 20 MG capsule Take 20 mg by mouth daily.   Yes [provider]  propranolol ER (INDERAL LA) 160 MG SR capsule Take 160 mg by mouth daily.   Yes [provider]  SUMAtriptan (IMITREX) 100 MG tablet Take 100 mg by mouth every 2 (two) hours as needed for migraine.   Yes [provider]  HYDROcodone-acetaminophen (NORCO/VICODIN) 5-325 MG per tablet Take 1-2 tablets by mouth every 4 (four) hours as needed for pain. 02/25/13   Barnett Abu, MD  rOPINIRole (REQUIP) 2 MG tablet Take 1 tablet (2 mg total) by mouth at bedtime. 04/02/17   Etosha Wetherell, Para March, NP   Meds Ordered and Administered this Visit  Medications - No data to display  BP 135/69 (BP Location: Left Arm)   Pulse 67   Temp 98.1 F (36.7 C) (Oral)   Resp 16   Ht 5\' 4"  (1.626 m)   Wt 145 lb (65.8 kg)   SpO2 99%   BMI 24.89 kg/m  No data found.   Physical Exam  Constitutional: She is oriented to person, place,  and time. Vital signs are normal. She appears well-developed and well-nourished. She is active and cooperative.  Non-toxic appearance. She does not have a sickly appearance. She does not appear ill. No distress.  HENT:  Head: Normocephalic.  Eyes: Pupils are equal, round, and reactive to light.  Neck: Normal range of motion.  Cardiovascular: Normal rate, regular rhythm and normal pulses.   Pulses:      Dorsalis pedis pulses are 2+ on the right side, and 2+ on the left side.  Pulmonary/Chest: Effort normal.  Musculoskeletal: Normal range of motion.  Neurological: She is alert and oriented to person, place, and time. She has normal strength. No cranial  nerve deficit or sensory deficit. Gait normal. GCS eye subscore is 4. GCS verbal subscore is 5. GCS motor subscore is 6.  Skin: Skin is warm, dry and intact. Capillary refill takes less than 2 seconds.  Psychiatric: She has a normal mood and affect. Her speech is normal and behavior is normal. Judgment and thought content normal.  Nursing note and vitals reviewed.   Urgent Care Course     Procedures (including critical care time)  Labs Review Labs Reviewed - No data to display  Imaging Review No results found.        MDM   1. Restless leg syndrome   2. Encounter for medication refill    We have refilled your requip today. Please keep follow up appt with PCP as scheduled on 04/14/17. Pt verbalized understanding to this provider. Verified dosage, pt has bottle with her.    Clancy Gourdefelice, Merridith Dershem, NP 04/02/17 1001

## 2018-01-28 ENCOUNTER — Emergency Department
Admission: EM | Admit: 2018-01-28 | Discharge: 2018-01-28 | Disposition: A | Discharge status: Home / Self Care | Payer: PRIVATE HEALTH INSURANCE | Attending: Emergency Medicine | Admitting: Emergency Medicine

## 2018-01-28 ENCOUNTER — Emergency Department: Payer: PRIVATE HEALTH INSURANCE

## 2018-01-28 ENCOUNTER — Encounter: Payer: Self-pay | Admitting: Emergency Medicine

## 2018-01-28 DIAGNOSIS — Z79899 Other long term (current) drug therapy: Secondary | ICD-10-CM | POA: Insufficient documentation

## 2018-01-28 DIAGNOSIS — R42 Dizziness and giddiness: Secondary | ICD-10-CM | POA: Diagnosis present

## 2018-01-28 DIAGNOSIS — I10 Essential (primary) hypertension: Secondary | ICD-10-CM | POA: Insufficient documentation

## 2018-01-28 LAB — CBC
HCT: 39.8 % (ref 35.0–47.0)
Hemoglobin: 12.5 g/dL (ref 12.0–16.0)
MCH: 25.8 pg — ABNORMAL LOW (ref 26.0–34.0)
MCHC: 31.4 g/dL — ABNORMAL LOW (ref 32.0–36.0)
MCV: 82.3 fL (ref 80.0–100.0)
Platelets: 229 10*3/uL (ref 150–440)
RBC: 4.84 MIL/uL (ref 3.80–5.20)
RDW: 16.1 % — ABNORMAL HIGH (ref 11.5–14.5)
WBC: 8.4 10*3/uL (ref 3.6–11.0)

## 2018-01-28 LAB — BASIC METABOLIC PANEL
ANION GAP: 7 (ref 5–15)
BUN: 17 mg/dL (ref 6–20)
CALCIUM: 8.9 mg/dL (ref 8.9–10.3)
CO2: 24 mmol/L (ref 22–32)
Chloride: 111 mmol/L (ref 101–111)
Creatinine, Ser: 0.77 mg/dL (ref 0.44–1.00)
GFR calc non Af Amer: >60 mL/min (ref >60)
Glucose, Bld: 130 mg/dL — ABNORMAL HIGH (ref 65–99)
Potassium: 4.1 mmol/L (ref 3.5–5.1)
Sodium: 142 mmol/L (ref 135–145)

## 2018-01-28 MED ORDER — MECLIZINE HCL 25 MG PO TABS
25.0000 mg | ORAL_TABLET | Freq: Once | ORAL | Status: AC
  Administered 2018-01-28: 25 mg via ORAL
  Filled 2018-01-28: qty 1

## 2018-01-28 MED ORDER — METOCLOPRAMIDE HCL 10 MG PO TABS
10.0000 mg | ORAL_TABLET | Freq: Once | ORAL | Status: AC
  Administered 2018-01-28: 10 mg via ORAL
  Filled 2018-01-28: qty 1

## 2018-01-28 MED ORDER — MECLIZINE HCL 25 MG PO TABS: 25.0000 mg | ORAL_TABLET | Freq: Three times a day (TID) | ORAL | 1 refills | Status: DC | PRN

## 2018-01-28 MED ORDER — ROPINIROLE HCL 1 MG PO TABS
2.0000 mg | ORAL_TABLET | ORAL | Status: AC
  Administered 2018-01-28: 2 mg via ORAL
  Filled 2018-01-28: qty 2

## 2018-01-28 MED ORDER — ONDANSETRON 4 MG PO TBDP
4.0000 mg | ORAL_TABLET | Freq: Once | ORAL | Status: AC | PRN
  Administered 2018-01-28: 4 mg via ORAL
  Filled 2018-01-28: qty 1

## 2018-01-28 MED ORDER — METOCLOPRAMIDE HCL 10 MG PO TABS: 10.0000 mg | ORAL_TABLET | Freq: Four times a day (QID) | ORAL | 0 refills | Status: DC | PRN

## 2018-01-28 MED ORDER — IBUPROFEN 600 MG PO TABS
600.0000 mg | ORAL_TABLET | Freq: Once | ORAL | Status: AC
  Administered 2018-01-28: 600 mg via ORAL
  Filled 2018-01-28: qty 1

## 2018-01-28 NOTE — ED Provider Notes (Signed)
[facility] Emergency Department Provider Note  ____________________________________________  Time seen: Approximately 7:32 PM  I have reviewed the triage vital signs and the nursing notes.   HISTORY  Chief Complaint Dizziness and Emesis    HPI Amanda Brooks is a 67 y.o. female who complains of dizziness described as room spinning this started yesterday morning. Worse with changing position sitting up and turning her head, better lying down supine and being still. Intermittent, lasted for 3 hours yesterday morning then resolved and then reoccurred this morning at 1 AM. Associated with vomiting. Denies headache, head trauma, paresthesias or weakness. No vision changes. No recent illness.Denies double vision or blurry vision. Denies tinnitus   patient notes that she started an angiotensin receptor blocker for blood pressure 1 week ago,and when the symptoms started 2 days ago, she discontinued the blood pressure medicine.    Past Medical History:  Diagnosis Date  . Anxiety   . Arthritis   . GERD (gastroesophageal reflux disease)   . Hypertension    Takes propranolol for HTN Does not see a cardiologist  . Migraine headache   . Restless leg syndrome   . Vitamin D deficiency      Patient Active Problem List   Diagnosis Date Noted  . Restless leg syndrome 04/02/2017  . Encounter for medication refill 04/02/2017     Past Surgical History:  Procedure Laterality Date  . APPENDECTOMY     done as part of tubal ligation  . TUBAL LIGATION       Prior to Admission medications   Medication Sig Start Date End Date Taking? Authorizing Provider  Cholecalciferol (VITAMIN D3) 2000 UNITS TABS Take 2,000 Units by mouth daily.   Yes [provider]  citalopram (CELEXA) 40 MG tablet Take 40 mg by mouth daily.   Yes [provider]  propranolol ER (INDERAL LA) 160 MG SR capsule Take 160 mg by mouth daily.   Yes [provider]   rOPINIRole (REQUIP) 2 MG tablet Take 1 tablet (2 mg total) by mouth at bedtime. 04/02/17  Yes Defelice, Para March, NP  telmisartan (MICARDIS) 20 MG tablet Take 1 tablet by mouth daily. 01/20/18  Yes [provider]  cyclobenzaprine (FLEXERIL) 10 MG tablet Take 1 tablet (10 mg total) by mouth 3 (three) times daily as needed for muscle spasms. Patient not taking: Reported on 01/28/2018 02/25/13   Barnett Abu, MD  HYDROcodone-acetaminophen (NORCO/VICODIN) 5-325 MG per tablet Take 1-2 tablets by mouth every 4 (four) hours as needed for pain. Patient not taking: Reported on 01/28/2018 02/25/13   Barnett Abu, MD  meclizine (ANTIVERT) 25 MG tablet Take 1 tablet (25 mg total) by mouth 3 (three) times daily as needed for dizziness or nausea. 01/28/18   Sharman Cheek, MD  meloxicam (MOBIC) 15 MG tablet Take 15 mg by mouth daily.    [provider]  metoCLOPramide (REGLAN) 10 MG tablet Take 1 tablet (10 mg total) by mouth every 6 (six) hours as needed. 01/28/18   Sharman Cheek, MD  omeprazole (PRILOSEC) 20 MG capsule Take 20 mg by mouth daily.    [provider]  SUMAtriptan (IMITREX) 100 MG tablet Take 100 mg by mouth every 2 (two) hours as needed for migraine.    [provider]     Allergies Percocet [oxycodone-acetaminophen]   No family history on file.  Social History Social History   Tobacco Use  . Smoking status: Never Smoker  . Smokeless tobacco: Never Used  Substance Use  Topics  . Alcohol use: No  . Drug use: No    Review of Systems  Constitutional:   No fever or chills.  ENT:   No sore throat. No rhinorrhea. Cardiovascular:   No chest pain or syncope. Respiratory:   No dyspnea or cough. Gastrointestinal:   Negative for abdominal pain, vomiting and diarrhea.  Musculoskeletal:   Negative for focal pain or swelling All other systems reviewed and are negative except as documented above in ROS and  HPI.  ____________________________________________   PHYSICAL EXAM:  VITAL SIGNS: ED Triage Vitals  Enc Vitals Group     BP 01/28/18 1451 (!) 178/90     Pulse Rate 01/28/18 1451 62     Resp 01/28/18 1451 16     Temp 01/28/18 1451 97.7 F (36.5 C)     Temp Source 01/28/18 1451 Oral     SpO2 01/28/18 1451 98 %     Weight 01/28/18 1452 150 lb (68 kg)     Height 01/28/18 1452 5\' 6"  (1.676 m)     Head Circumference --      Peak Flow --      Pain Score 01/28/18 1451 4     Pain Loc --      Pain Edu? --      Excl. in GC? --     Vital signs reviewed, nursing assessments reviewed.   Constitutional:   Alert and oriented. Well appearing and in no distress. Eyes:   No scleral icterus.  EOMI. No nystagmus. No conjunctival pallor. PERRL. ENT   Head:   Normocephalic and atraumatic.external canals and TMs normal bilaterally   Nose:   No congestion/rhinnorhea.    Mouth/Throat:   MMM, no pharyngeal erythema. No peritonsillar mass.    Neck:   No meningismus. Full ROM. Hematological/Lymphatic/Immunilogical:   No cervical lymphadenopathy. Cardiovascular:   RRR. Symmetric bilateral radial and DP pulses.  No murmurs.  Respiratory:   Normal respiratory effort without tachypnea/retractions. Breath sounds are clear and equal bilaterally. No wheezes/rales/rhonchi. Gastrointestinal:   Soft and nontender. Non distended. There is no CVA tenderness.  No rebound, rigidity, or guarding. Genitourinary:   deferred Musculoskeletal:   Normal range of motion in all extremities. No joint effusions.  No lower extremity tenderness.  No edema. Neurologic:   Normal speech and language.  normal finger to nose, no pronator drift Cranial nerves III through XII intact Motor grossly intact. No acute focal neurologic deficits are appreciated.  Skin:    Skin is warm, dry and intact. No rash noted.  No petechiae, purpura, or bullae.  ____________________________________________    LABS (pertinent  positives/negatives) (all labs ordered are listed, but only abnormal results are displayed) Labs Reviewed  BASIC METABOLIC PANEL - Abnormal; Notable for the following components:      Result Value   Glucose, Bld 130 (*)    All other components within normal limits  CBC - Abnormal; Notable for the following components:   MCH 25.8 (*)    MCHC 31.4 (*)    RDW 16.1 (*)    All other components within normal limits  URINALYSIS, COMPLETE (UACMP) WITH MICROSCOPIC   ____________________________________________   EKG  interpreted by me Normal sinus rhythm rate of 62, normal axis intervals QRS ST segments and T waves.  ____________________________________________    RADIOLOGY  Ct Head Wo Contrast  Result Date: 01/28/2018 CLINICAL DATA:  Acute onset of dizziness and headache that began at 1 o'clock a.m. this morning. EXAM: CT HEAD WITHOUT CONTRAST TECHNIQUE:  Contiguous axial images were obtained from the base of the skull through the vertex without intravenous contrast. COMPARISON:  None. FINDINGS: Brain: Ventricular system normal in size and appearance for age. Minimal age-appropriate cortical atrophy. No mass lesion. No midline shift. No acute hemorrhage or hematoma. No extra-axial fluid collections. No evidence of acute infarction. Vascular: Mild bilateral carotid siphon atherosclerosis. No hyperdense vessel. Skull: No skull fracture or other focal osseous abnormality involving the skull. Sinuses/Orbits: Near complete opacification of the right sphenoid sinus. Remaining visualized paranasal sinuses, bilateral mastoid air cells and bilateral middle ear cavities well-aerated. Visualized orbits and globes unremarkable. Other: None. IMPRESSION: 1. No acute intracranial abnormality. 2. Right sphenoid sinusitis, likely chronic. Electronically Signed   By: Hulan Saas M.D.   On: 01/28/2018 15:53     ____________________________________________   PROCEDURES Procedures  ____________________________________________    CLINICAL IMPRESSION / ASSESSMENT AND PLAN / ED COURSE  Pertinent labs & imaging results that were available during my care of the patient were reviewed by me and considered in my medical decision making (see chart for details).     Clinical Course as of Jan 28 2053  Sat Jan 28, 2018  1830 P/w peripheral vertigo. Labs unremarkable. CT head shows no evidence of ICH or stroke. Exam nonfocal, reassuring.   Considering the patient's symptoms, medical history, and physical examination today, I have low suspicion for ischemic stroke, intracranial hemorrhage, meningitis, encephalitis, carotid or vertebral dissection, venous sinus thrombosis, MS, intracranial hypertension, glaucoma, CRAO, CRVO, or temporal arteritis.   [PS]  1831 Plan meclizine, reglan, po trial.  [PS]    Clinical Course User Index [PS] Sharman Cheek, MD     ----------------------------------------- 8:54 PM on 01/28/2018 -----------------------------------------  Is feeling better, tolerating oral intake. We'll discharge to follow-up with primary care regarding her blood pressure management.  ____________________________________________   FINAL CLINICAL IMPRESSION(S) / ED DIAGNOSES    Final diagnoses:  Vertigo     ED Discharge Orders        Ordered    meclizine (ANTIVERT) 25 MG tablet  3 times daily PRN     01/28/18 2053    metoCLOPramide (REGLAN) 10 MG tablet  Every 6 hours PRN     01/28/18 2053      Portions of this note were generated with dragon dictation software. Dictation errors may occur despite best attempts at proofreading.    Sharman Cheek, MD 01/28/18 [phone]

## 2018-01-28 NOTE — ED Triage Notes (Signed)
Patient presents to the ED with nausea, vomiting and dizziness since 1 am.  Patient reports vomiting >10 times.  Patient reports she was recently started on Micardis but hasn't taken it for the past 2 days because she was "feeling weird."  Patient states movement makes her dizziness and nausea worse.  Patient states it feels like the room is spinning.  Patient is pale and clammy.

## 2019-09-25 IMAGING — CT CT HEAD W/O CM
3 series · 15 of 45 positions shown, 18 images · non-contrast
Comparison: None.

CLINICAL DATA: Acute onset of dizziness and headache that began at
1 o'clock a.m. this morning.

EXAM:
CT HEAD WITHOUT CONTRAST
TECHNIQUE: Contiguous axial images were obtained from the base of the skull
through the vertex without intravenous contrast.

[Series 3: coronal soft tissue · coronal · 0.27mm/px · 3 of 64 slices shown]
[im 22/64  brain]
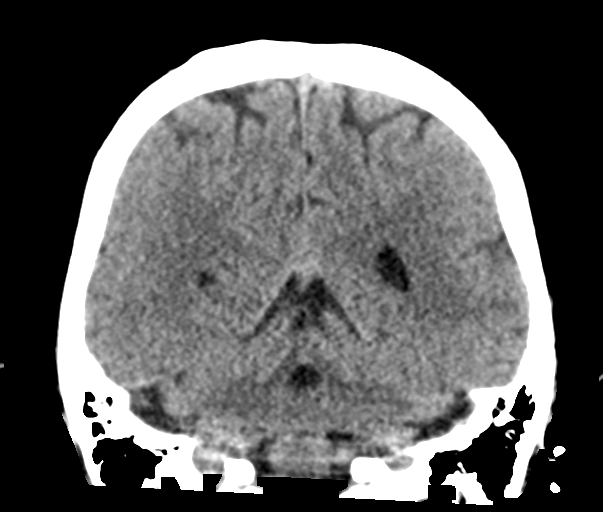
[im 29/64  brain]
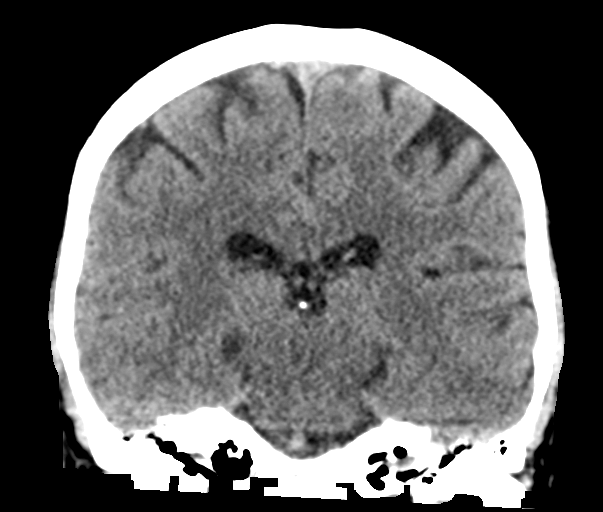
[im 36/64  brain]
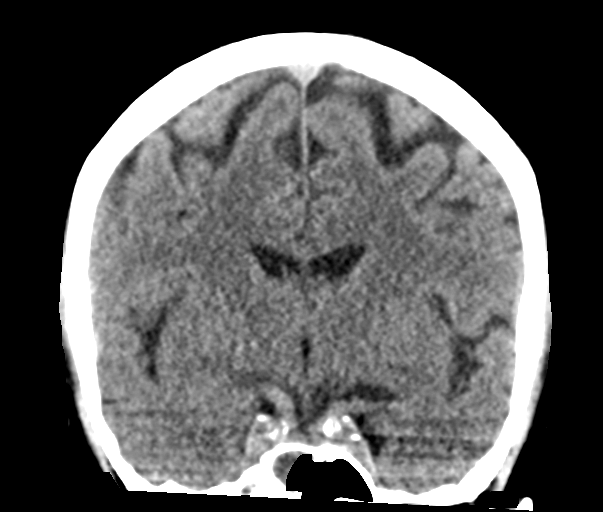

[Series 4: head wo · axial · 0.47mm/px · z∈[-162,-47]mm · 9 of 28 slices shown, 12 images]
[im 3/28  brain]
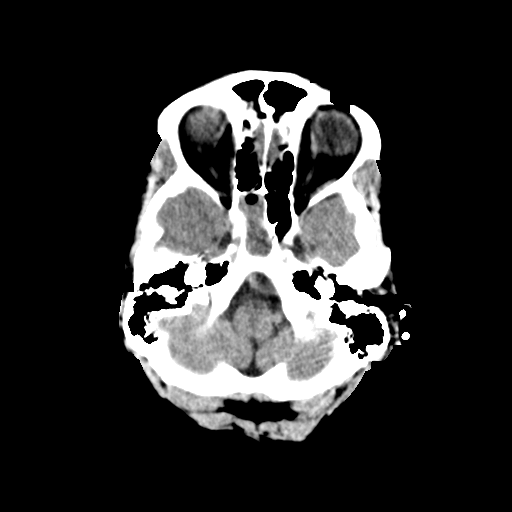
[im 3/28  bone]
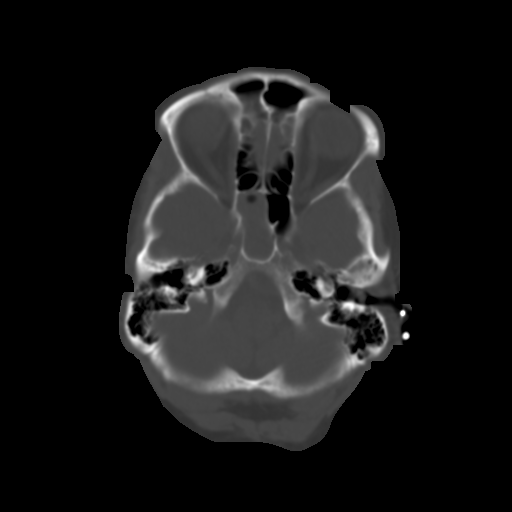
[im 6/28  brain]
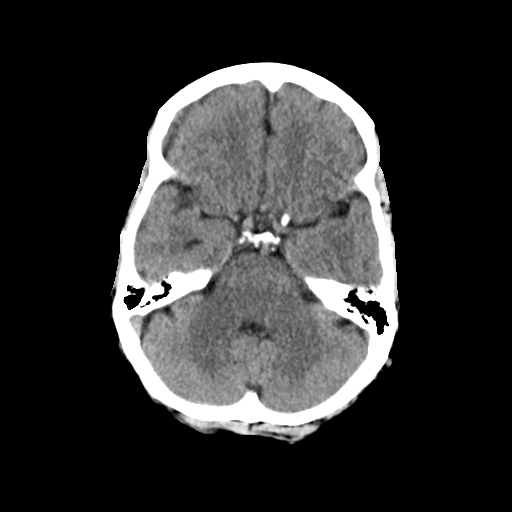
[im 9/28  brain]
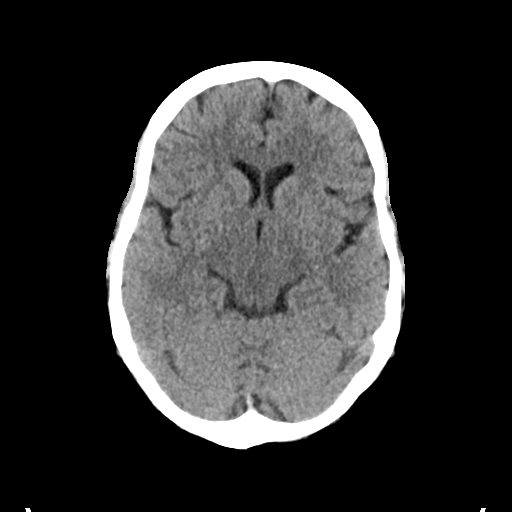
[im 12/28  brain]
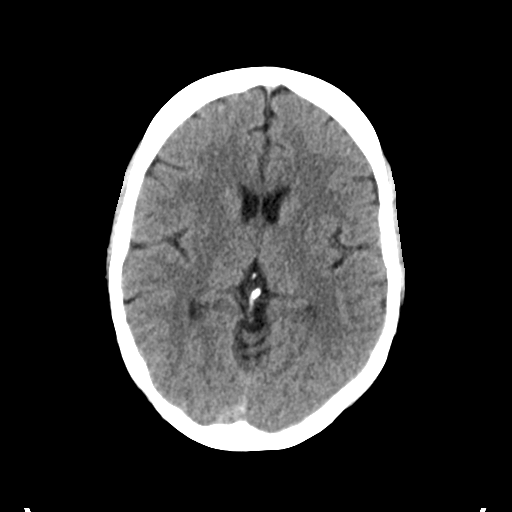
[im 15/28  brain]
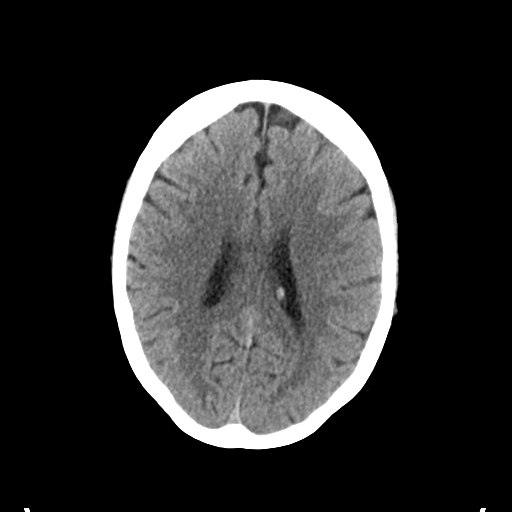
[im 15/28  bone]
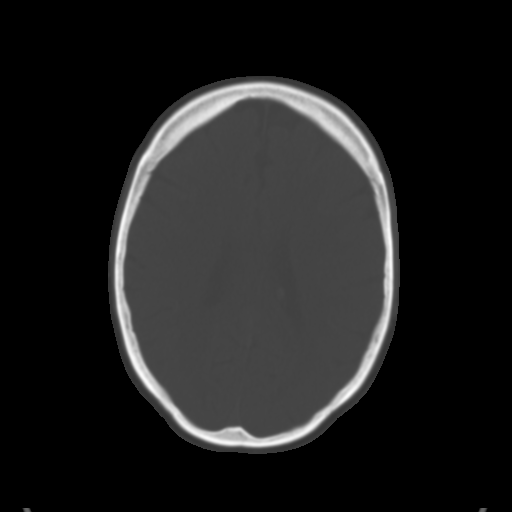
[im 17/28  brain]
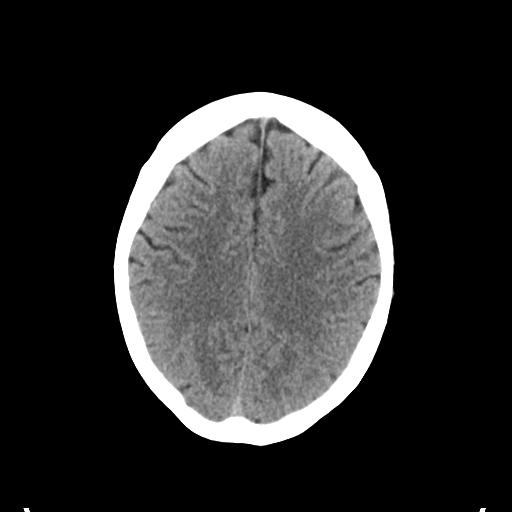
[im 20/28  brain]
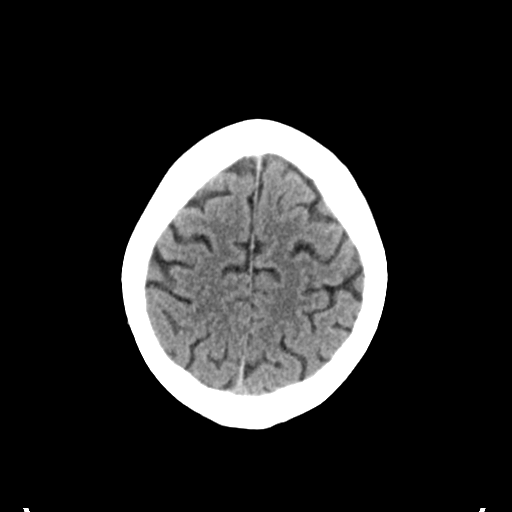
[im 23/28  brain]
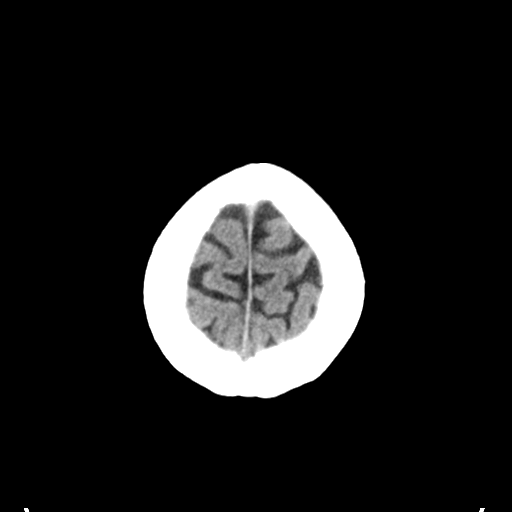
[im 26/28  brain]
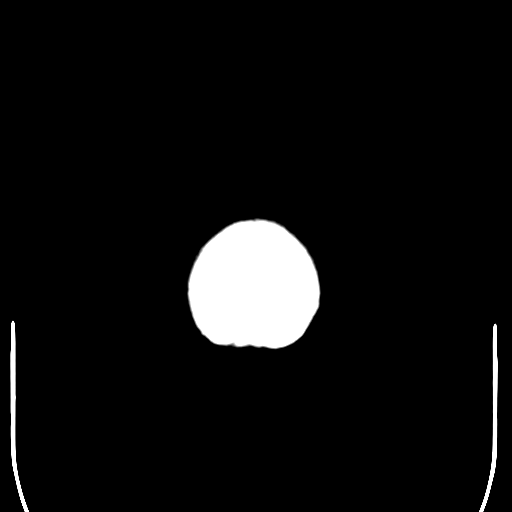
[im 26/28  bone]
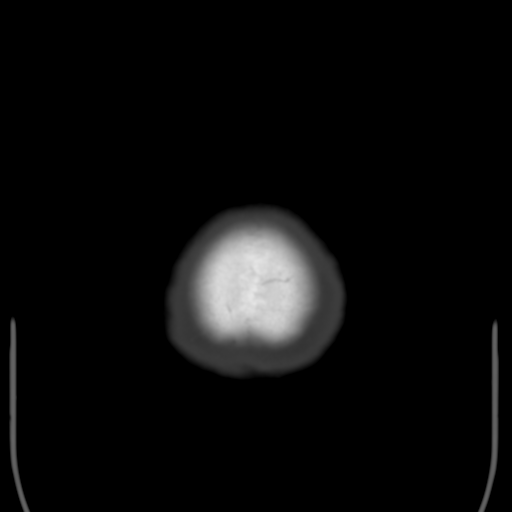

[Series 5: sagittal soft tissue · sagittal · 0.28mm/px · 3 of 50 slices shown]
[im 17/50  brain]
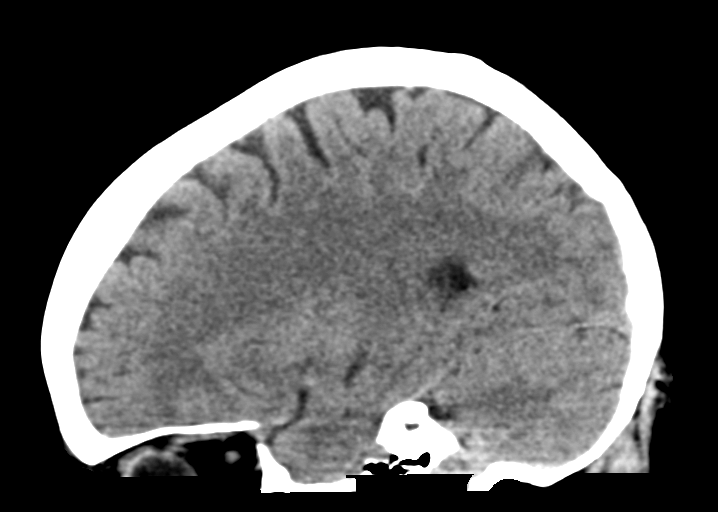
[im 25/50  brain]
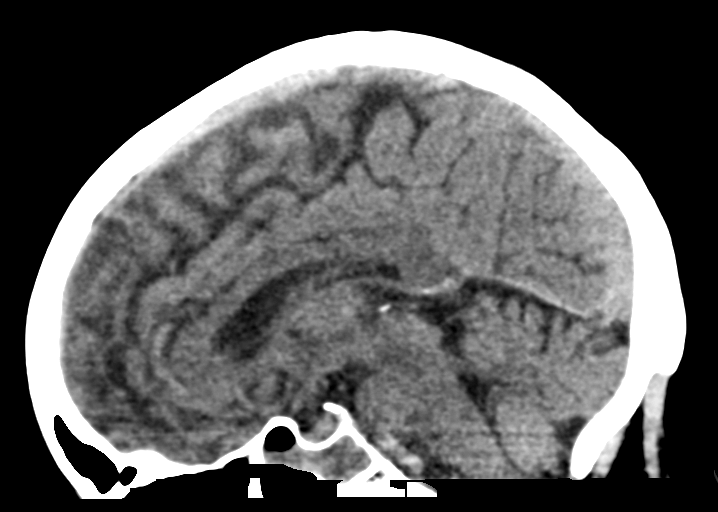
[im 33/50  brain]
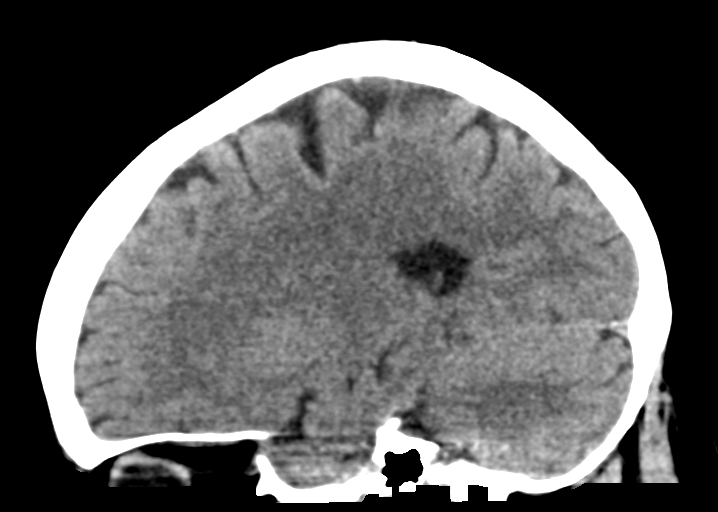

[15 of 45 positions shown; findings below may reference images not displayed]

FINDINGS: Brain: Ventricular system normal in size and appearance for age.
Minimal age-appropriate cortical atrophy. No mass lesion. No midline
shift. No acute hemorrhage or hematoma. No extra-axial fluid
collections. No evidence of acute infarction.

Vascular: Mild bilateral carotid siphon atherosclerosis. No
hyperdense vessel.

Skull: No skull fracture or other focal osseous abnormality
involving the skull.

Sinuses/Orbits: Near complete opacification of the right sphenoid
sinus. Remaining visualized paranasal sinuses, bilateral mastoid air
cells and bilateral middle ear cavities well-aerated. Visualized
orbits and globes unremarkable.

Other: None.
IMPRESSION: 1. No acute intracranial abnormality.
2. Right sphenoid sinusitis, likely chronic.

## 2019-12-20 DIAGNOSIS — K259 Gastric ulcer, unspecified as acute or chronic, without hemorrhage or perforation: Secondary | ICD-10-CM | POA: Diagnosis not present

## 2019-12-20 DIAGNOSIS — R519 Headache, unspecified: Secondary | ICD-10-CM | POA: Diagnosis not present

## 2020-02-11 DIAGNOSIS — D500 Iron deficiency anemia secondary to blood loss (chronic): Secondary | ICD-10-CM | POA: Diagnosis not present

## 2020-02-11 DIAGNOSIS — K253 Acute gastric ulcer without hemorrhage or perforation: Secondary | ICD-10-CM | POA: Diagnosis not present

## 2020-02-11 DIAGNOSIS — K449 Diaphragmatic hernia without obstruction or gangrene: Secondary | ICD-10-CM | POA: Diagnosis not present

## 2020-02-11 DIAGNOSIS — R030 Elevated blood-pressure reading, without diagnosis of hypertension: Secondary | ICD-10-CM | POA: Diagnosis not present

## 2020-03-04 DIAGNOSIS — D649 Anemia, unspecified: Secondary | ICD-10-CM | POA: Diagnosis not present

## 2020-03-04 DIAGNOSIS — K250 Acute gastric ulcer with hemorrhage: Secondary | ICD-10-CM | POA: Diagnosis not present

## 2020-03-04 DIAGNOSIS — K449 Diaphragmatic hernia without obstruction or gangrene: Secondary | ICD-10-CM | POA: Diagnosis not present

## 2020-03-04 DIAGNOSIS — D500 Iron deficiency anemia secondary to blood loss (chronic): Secondary | ICD-10-CM | POA: Diagnosis not present

## 2020-03-26 DIAGNOSIS — D649 Anemia, unspecified: Secondary | ICD-10-CM | POA: Diagnosis not present

## 2020-03-26 DIAGNOSIS — M7022 Olecranon bursitis, left elbow: Secondary | ICD-10-CM | POA: Diagnosis not present

## 2020-03-26 DIAGNOSIS — D500 Iron deficiency anemia secondary to blood loss (chronic): Secondary | ICD-10-CM | POA: Diagnosis not present

## 2020-03-26 DIAGNOSIS — K259 Gastric ulcer, unspecified as acute or chronic, without hemorrhage or perforation: Secondary | ICD-10-CM | POA: Diagnosis not present

## 2020-03-26 DIAGNOSIS — K449 Diaphragmatic hernia without obstruction or gangrene: Secondary | ICD-10-CM | POA: Diagnosis not present

## 2020-04-15 DIAGNOSIS — K259 Gastric ulcer, unspecified as acute or chronic, without hemorrhage or perforation: Secondary | ICD-10-CM | POA: Diagnosis not present

## 2020-04-15 DIAGNOSIS — M7022 Olecranon bursitis, left elbow: Secondary | ICD-10-CM | POA: Diagnosis not present

## 2020-04-15 DIAGNOSIS — D500 Iron deficiency anemia secondary to blood loss (chronic): Secondary | ICD-10-CM | POA: Diagnosis not present

## 2020-04-15 DIAGNOSIS — K449 Diaphragmatic hernia without obstruction or gangrene: Secondary | ICD-10-CM | POA: Diagnosis not present

## 2020-04-15 DIAGNOSIS — D509 Iron deficiency anemia, unspecified: Secondary | ICD-10-CM | POA: Diagnosis not present

## 2020-04-29 DIAGNOSIS — D509 Iron deficiency anemia, unspecified: Secondary | ICD-10-CM | POA: Diagnosis not present

## 2020-07-04 ENCOUNTER — Ambulatory Visit: Payer: PRIVATE HEALTH INSURANCE | Attending: Internal Medicine

## 2020-07-04 DIAGNOSIS — Z23 Encounter for immunization: Secondary | ICD-10-CM

## 2020-07-04 NOTE — Progress Notes (Signed)
   Covid-[facility] Vaccination [facility]  Name:  Amanda Brooks    MRN: [id number] DOB: 1951/06/30  07/04/2020  Amanda Brooks was observed post Covid-19 immunization for 15 minutes without incident. She was provided with Vaccine Information Sheet and instruction to access the V-Safe system.   Amanda Brooks was instructed to call 911 with any severe reactions post vaccine: Marland Kitchen Difficulty breathing  . Swelling of face and throat  . A fast heartbeat  . A bad rash all over body  . Dizziness and weakness   Immunizations Administered    Name Date Dose VIS Date Route   Pfizer COVID-19 Vaccine 07/04/2020 11:24 AM 0.3 mL 01/09/2019 Intramuscular   Manufacturer: [employer], [employer]   Lot: [id number]   NDC: [address]-1

## 2020-07-08 DIAGNOSIS — K315 Obstruction of duodenum: Secondary | ICD-10-CM | POA: Diagnosis not present

## 2020-07-08 DIAGNOSIS — K259 Gastric ulcer, unspecified as acute or chronic, without hemorrhage or perforation: Secondary | ICD-10-CM | POA: Diagnosis not present

## 2020-07-08 DIAGNOSIS — K219 Gastro-esophageal reflux disease without esophagitis: Secondary | ICD-10-CM | POA: Diagnosis not present

## 2020-07-08 DIAGNOSIS — D509 Iron deficiency anemia, unspecified: Secondary | ICD-10-CM | POA: Diagnosis not present

## 2020-07-08 DIAGNOSIS — K2980 Duodenitis without bleeding: Secondary | ICD-10-CM | POA: Diagnosis not present

## 2020-07-08 DIAGNOSIS — K253 Acute gastric ulcer without hemorrhage or perforation: Secondary | ICD-10-CM | POA: Diagnosis not present

## 2020-07-08 DIAGNOSIS — I10 Essential (primary) hypertension: Secondary | ICD-10-CM | POA: Diagnosis not present

## 2020-07-08 DIAGNOSIS — Z79899 Other long term (current) drug therapy: Secondary | ICD-10-CM | POA: Diagnosis not present

## 2020-07-08 DIAGNOSIS — K449 Diaphragmatic hernia without obstruction or gangrene: Secondary | ICD-10-CM | POA: Diagnosis not present

## 2020-07-29 DIAGNOSIS — D649 Anemia, unspecified: Secondary | ICD-10-CM | POA: Diagnosis not present

## 2020-07-29 DIAGNOSIS — Z79899 Other long term (current) drug therapy: Secondary | ICD-10-CM | POA: Diagnosis not present

## 2020-07-29 DIAGNOSIS — M810 Age-related osteoporosis without current pathological fracture: Secondary | ICD-10-CM | POA: Diagnosis not present

## 2020-07-29 DIAGNOSIS — I10 Essential (primary) hypertension: Secondary | ICD-10-CM | POA: Diagnosis not present

## 2020-07-29 DIAGNOSIS — E559 Vitamin D deficiency, unspecified: Secondary | ICD-10-CM | POA: Diagnosis not present

## 2020-08-05 DIAGNOSIS — R0602 Shortness of breath: Secondary | ICD-10-CM | POA: Diagnosis not present

## 2020-08-05 DIAGNOSIS — Z0001 Encounter for general adult medical examination with abnormal findings: Secondary | ICD-10-CM | POA: Diagnosis not present

## 2020-08-05 DIAGNOSIS — M810 Age-related osteoporosis without current pathological fracture: Secondary | ICD-10-CM | POA: Diagnosis not present

## 2020-08-05 DIAGNOSIS — J984 Other disorders of lung: Secondary | ICD-10-CM | POA: Diagnosis not present

## 2020-08-05 DIAGNOSIS — R5383 Other fatigue: Secondary | ICD-10-CM | POA: Diagnosis not present

## 2020-08-05 DIAGNOSIS — I10 Essential (primary) hypertension: Secondary | ICD-10-CM | POA: Diagnosis not present

## 2020-08-05 DIAGNOSIS — R918 Other nonspecific abnormal finding of lung field: Secondary | ICD-10-CM | POA: Diagnosis not present

## 2020-08-05 DIAGNOSIS — Z79899 Other long term (current) drug therapy: Secondary | ICD-10-CM | POA: Diagnosis not present

## 2020-08-05 DIAGNOSIS — D500 Iron deficiency anemia secondary to blood loss (chronic): Secondary | ICD-10-CM | POA: Diagnosis not present

## 2020-08-05 DIAGNOSIS — R9431 Abnormal electrocardiogram [ECG] [EKG]: Secondary | ICD-10-CM | POA: Diagnosis not present

## 2020-08-05 DIAGNOSIS — R519 Headache, unspecified: Secondary | ICD-10-CM | POA: Diagnosis not present

## 2020-08-05 DIAGNOSIS — M47814 Spondylosis without myelopathy or radiculopathy, thoracic region: Secondary | ICD-10-CM | POA: Diagnosis not present

## 2020-08-05 DIAGNOSIS — Z5181 Encounter for therapeutic drug level monitoring: Secondary | ICD-10-CM | POA: Diagnosis not present

## 2020-08-05 DIAGNOSIS — R079 Chest pain, unspecified: Secondary | ICD-10-CM | POA: Diagnosis not present

## 2020-08-05 DIAGNOSIS — D509 Iron deficiency anemia, unspecified: Secondary | ICD-10-CM | POA: Diagnosis not present

## 2020-08-06 DIAGNOSIS — D500 Iron deficiency anemia secondary to blood loss (chronic): Secondary | ICD-10-CM | POA: Diagnosis not present

## 2020-08-13 DIAGNOSIS — D500 Iron deficiency anemia secondary to blood loss (chronic): Secondary | ICD-10-CM | POA: Diagnosis not present

## 2020-08-19 DIAGNOSIS — D500 Iron deficiency anemia secondary to blood loss (chronic): Secondary | ICD-10-CM | POA: Diagnosis not present

## 2020-08-19 DIAGNOSIS — K250 Acute gastric ulcer with hemorrhage: Secondary | ICD-10-CM | POA: Diagnosis not present

## 2020-09-01 DIAGNOSIS — M8589 Other specified disorders of bone density and structure, multiple sites: Secondary | ICD-10-CM | POA: Diagnosis not present

## 2020-09-01 DIAGNOSIS — Z1231 Encounter for screening mammogram for malignant neoplasm of breast: Secondary | ICD-10-CM | POA: Diagnosis not present

## 2020-09-16 DIAGNOSIS — D500 Iron deficiency anemia secondary to blood loss (chronic): Secondary | ICD-10-CM | POA: Diagnosis not present

## 2020-09-24 DIAGNOSIS — B372 Candidiasis of skin and nail: Secondary | ICD-10-CM | POA: Diagnosis not present

## 2020-09-24 DIAGNOSIS — Z872 Personal history of diseases of the skin and subcutaneous tissue: Secondary | ICD-10-CM | POA: Diagnosis not present

## 2020-09-24 DIAGNOSIS — L578 Other skin changes due to chronic exposure to nonionizing radiation: Secondary | ICD-10-CM | POA: Diagnosis not present

## 2020-09-24 DIAGNOSIS — L918 Other hypertrophic disorders of the skin: Secondary | ICD-10-CM | POA: Diagnosis not present

## 2020-09-24 DIAGNOSIS — L218 Other seborrheic dermatitis: Secondary | ICD-10-CM | POA: Diagnosis not present

## 2020-10-29 DIAGNOSIS — D500 Iron deficiency anemia secondary to blood loss (chronic): Secondary | ICD-10-CM | POA: Diagnosis not present

## 2021-09-30 ENCOUNTER — Encounter
Admission: EM | Disposition: A | Discharge status: Home / Self Care | Payer: Self-pay | Source: Home / Self Care | Attending: Internal Medicine

## 2021-09-30 ENCOUNTER — Inpatient Hospital Stay: Payer: Medicare Other | Admitting: Anesthesiology

## 2021-09-30 ENCOUNTER — Emergency Department: Payer: Medicare Other

## 2021-09-30 ENCOUNTER — Inpatient Hospital Stay
Admission: EM | Admit: 2021-09-30 | Discharge: 2021-10-03 | DRG: 378 | Disposition: A | Discharge status: Home / Self Care | Payer: Medicare Other | Attending: Internal Medicine | Admitting: Internal Medicine

## 2021-09-30 ENCOUNTER — Other Ambulatory Visit: Payer: Self-pay

## 2021-09-30 ENCOUNTER — Encounter: Payer: Self-pay | Admitting: Emergency Medicine

## 2021-09-30 DIAGNOSIS — Z20822 Contact with and (suspected) exposure to covid-19: Secondary | ICD-10-CM | POA: Diagnosis present

## 2021-09-30 DIAGNOSIS — D72829 Elevated white blood cell count, unspecified: Secondary | ICD-10-CM | POA: Diagnosis present

## 2021-09-30 DIAGNOSIS — T39395A Adverse effect of other nonsteroidal anti-inflammatory drugs [NSAID], initial encounter: Secondary | ICD-10-CM | POA: Diagnosis present

## 2021-09-30 DIAGNOSIS — K264 Chronic or unspecified duodenal ulcer with hemorrhage: Secondary | ICD-10-CM | POA: Diagnosis present

## 2021-09-30 DIAGNOSIS — K219 Gastro-esophageal reflux disease without esophagitis: Secondary | ICD-10-CM | POA: Diagnosis present

## 2021-09-30 DIAGNOSIS — I10 Essential (primary) hypertension: Secondary | ICD-10-CM | POA: Diagnosis present

## 2021-09-30 DIAGNOSIS — Z886 Allergy status to analgesic agent status: Secondary | ICD-10-CM | POA: Diagnosis not present

## 2021-09-30 DIAGNOSIS — F419 Anxiety disorder, unspecified: Secondary | ICD-10-CM | POA: Diagnosis present

## 2021-09-30 DIAGNOSIS — Q438 Other specified congenital malformations of intestine: Secondary | ICD-10-CM

## 2021-09-30 DIAGNOSIS — K640 First degree hemorrhoids: Secondary | ICD-10-CM | POA: Diagnosis present

## 2021-09-30 DIAGNOSIS — R42 Dizziness and giddiness: Secondary | ICD-10-CM | POA: Diagnosis present

## 2021-09-30 DIAGNOSIS — Z79899 Other long term (current) drug therapy: Secondary | ICD-10-CM | POA: Diagnosis not present

## 2021-09-30 DIAGNOSIS — R197 Diarrhea, unspecified: Secondary | ICD-10-CM | POA: Diagnosis present

## 2021-09-30 DIAGNOSIS — D62 Acute posthemorrhagic anemia: Secondary | ICD-10-CM | POA: Diagnosis present

## 2021-09-30 DIAGNOSIS — R000 Tachycardia, unspecified: Secondary | ICD-10-CM | POA: Diagnosis present

## 2021-09-30 DIAGNOSIS — Z791 Long term (current) use of non-steroidal anti-inflammatories (NSAID): Secondary | ICD-10-CM | POA: Diagnosis not present

## 2021-09-30 DIAGNOSIS — K254 Chronic or unspecified gastric ulcer with hemorrhage: Principal | ICD-10-CM | POA: Diagnosis present

## 2021-09-30 DIAGNOSIS — M1990 Unspecified osteoarthritis, unspecified site: Secondary | ICD-10-CM | POA: Diagnosis present

## 2021-09-30 DIAGNOSIS — K449 Diaphragmatic hernia without obstruction or gangrene: Secondary | ICD-10-CM | POA: Diagnosis present

## 2021-09-30 DIAGNOSIS — G2581 Restless legs syndrome: Secondary | ICD-10-CM | POA: Diagnosis present

## 2021-09-30 DIAGNOSIS — K2970 Gastritis, unspecified, without bleeding: Secondary | ICD-10-CM | POA: Diagnosis present

## 2021-09-30 DIAGNOSIS — K315 Obstruction of duodenum: Secondary | ICD-10-CM | POA: Diagnosis present

## 2021-09-30 DIAGNOSIS — I959 Hypotension, unspecified: Secondary | ICD-10-CM | POA: Diagnosis present

## 2021-09-30 DIAGNOSIS — K922 Gastrointestinal hemorrhage, unspecified: Secondary | ICD-10-CM | POA: Diagnosis present

## 2021-09-30 DIAGNOSIS — E876 Hypokalemia: Secondary | ICD-10-CM | POA: Diagnosis present

## 2021-09-30 DIAGNOSIS — M62838 Other muscle spasm: Secondary | ICD-10-CM | POA: Diagnosis present

## 2021-09-30 DIAGNOSIS — G43909 Migraine, unspecified, not intractable, without status migrainosus: Secondary | ICD-10-CM | POA: Diagnosis present

## 2021-09-30 HISTORY — PX: ESOPHAGOGASTRODUODENOSCOPY: SHX5428

## 2021-09-30 LAB — CBC WITH DIFFERENTIAL/PLATELET
Abs Immature Granulocytes: 0.2 10*3/uL — ABNORMAL HIGH (ref 0.00–0.07)
Basophils Absolute: 0.1 10*3/uL (ref 0.0–0.1)
Basophils Relative: 0 %
Eosinophils Absolute: 0 10*3/uL (ref 0.0–0.5)
Eosinophils Relative: 0 %
HCT: 21.8 % — ABNORMAL LOW (ref 36.0–46.0)
Hemoglobin: 7 g/dL — ABNORMAL LOW (ref 12.0–15.0)
Immature Granulocytes: 1 %
Lymphocytes Relative: 11 %
Lymphs Abs: 2.5 10*3/uL (ref 0.7–4.0)
MCH: 30.6 pg (ref 26.0–34.0)
MCHC: 32.1 g/dL (ref 30.0–36.0)
MCV: 95.2 fL (ref 80.0–100.0)
Monocytes Absolute: 1.7 10*3/uL — ABNORMAL HIGH (ref 0.1–1.0)
Monocytes Relative: 8 %
Neutro Abs: 18 10*3/uL — ABNORMAL HIGH (ref 1.7–7.7)
Neutrophils Relative %: 80 %
Platelets: 222 10*3/uL (ref 150–400)
RBC: 2.29 MIL/uL — ABNORMAL LOW (ref 3.87–5.11)
RDW: 13.6 % (ref 11.5–15.5)
WBC: 22.5 10*3/uL — ABNORMAL HIGH (ref 4.0–10.5)
nRBC: 0 % (ref 0.0–0.2)

## 2021-09-30 LAB — PREPARE RBC (CROSSMATCH)

## 2021-09-30 LAB — COMPREHENSIVE METABOLIC PANEL
ALT: 17 U/L (ref 0–44)
AST: 26 U/L (ref 15–41)
Albumin: 3.4 g/dL — ABNORMAL LOW (ref 3.5–5.0)
Alkaline Phosphatase: 52 U/L (ref 38–126)
Anion gap: 9 (ref 5–15)
BUN: 35 mg/dL — ABNORMAL HIGH (ref 8–23)
CO2: 18 mmol/L — ABNORMAL LOW (ref 22–32)
Calcium: 8.3 mg/dL — ABNORMAL LOW (ref 8.9–10.3)
Chloride: 111 mmol/L (ref 98–111)
Creatinine, Ser: 0.85 mg/dL (ref 0.44–1.00)
GFR, Estimated: >60 mL/min (ref >60)
Glucose, Bld: 171 mg/dL — ABNORMAL HIGH (ref 70–99)
Potassium: 3.8 mmol/L (ref 3.5–5.1)
Sodium: 138 mmol/L (ref 135–145)
Total Bilirubin: 0.4 mg/dL (ref 0.3–1.2)
Total Protein: 6.1 g/dL — ABNORMAL LOW (ref 6.5–8.1)

## 2021-09-30 LAB — RESP PANEL BY RT-PCR (FLU A&B, COVID) ARPGX2
Influenza A by PCR: NEGATIVE
Influenza B by PCR: NEGATIVE
SARS Coronavirus 2 by RT PCR: NEGATIVE

## 2021-09-30 LAB — GLUCOSE, CAPILLARY: Glucose-Capillary: 84 mg/dL (ref 70–99)

## 2021-09-30 LAB — HEMOGLOBIN AND HEMATOCRIT, BLOOD
HCT: 29.4 % — ABNORMAL LOW (ref 36.0–46.0)
HCT: 27.8 % — ABNORMAL LOW (ref 36.0–46.0)
Hemoglobin: 10.1 g/dL — ABNORMAL LOW (ref 12.0–15.0)
Hemoglobin: 9.6 g/dL — ABNORMAL LOW (ref 12.0–15.0)

## 2021-09-30 LAB — MRSA NEXT GEN BY PCR, NASAL: MRSA by PCR Next Gen: NOT DETECTED

## 2021-09-30 LAB — PROTIME-INR
INR: 1.1 (ref 0.8–1.2)
Prothrombin Time: 14.6 seconds (ref 11.4–15.2)

## 2021-09-30 LAB — HIV ANTIBODY (ROUTINE TESTING W REFLEX): HIV Screen 4th Generation wRfx: NONREACTIVE

## 2021-09-30 SURGERY — EGD (ESOPHAGOGASTRODUODENOSCOPY)
Anesthesia: General

## 2021-09-30 MED ORDER — SODIUM CHLORIDE 0.9 % IV SOLN: INTRAVENOUS | Status: DC

## 2021-09-30 MED ORDER — PROPOFOL 10 MG/ML IV BOLUS
INTRAVENOUS | Status: DC | PRN
  Administered 2021-09-30 (×4): 20 mg via INTRAVENOUS
  Administered 2021-09-30: 50 mg via INTRAVENOUS

## 2021-09-30 MED ORDER — PROPRANOLOL HCL ER 80 MG PO CP24
160.0000 mg | ORAL_CAPSULE | Freq: Every day | ORAL | Status: DC
  Administered 2021-10-01 – 2021-10-03 (×3): 160 mg via ORAL
  Filled 2021-09-30 (×4): qty 2

## 2021-09-30 MED ORDER — PANTOPRAZOLE INFUSION (NEW) - SIMPLE MED
8.0000 mg/h | INTRAVENOUS | Status: DC
  Administered 2021-09-30 – 2021-10-01 (×4): 8 mg/h via INTRAVENOUS
  Filled 2021-09-30 (×2): qty 80
  Filled 2021-09-30: qty 100

## 2021-09-30 MED ORDER — PHENYLEPHRINE HCL (PRESSORS) 10 MG/ML IV SOLN
INTRAVENOUS | Status: DC | PRN
  Administered 2021-09-30: 100 ug via INTRAVENOUS

## 2021-09-30 MED ORDER — SODIUM CHLORIDE 0.9% FLUSH
3.0000 mL | Freq: Two times a day (BID) | INTRAVENOUS | Status: DC
  Administered 2021-09-30 – 2021-10-03 (×7): 3 mL via INTRAVENOUS

## 2021-09-30 MED ORDER — ONDANSETRON HCL 4 MG/2ML IJ SOLN: 4.0000 mg | Freq: Four times a day (QID) | INTRAMUSCULAR | Status: DC | PRN

## 2021-09-30 MED ORDER — SODIUM CHLORIDE 0.9 % IV SOLN: 10.0000 mL/h | Freq: Once | INTRAVENOUS | Status: DC

## 2021-09-30 MED ORDER — IOHEXOL 350 MG/ML SOLN
100.0000 mL | Freq: Once | INTRAVENOUS | Status: AC | PRN
  Administered 2021-09-30: 100 mL via INTRAVENOUS

## 2021-09-30 MED ORDER — SODIUM CHLORIDE 0.9 % IV SOLN: INTRAVENOUS | Status: DC | PRN

## 2021-09-30 MED ORDER — PROPOFOL 500 MG/50ML IV EMUL
INTRAVENOUS | Status: DC | PRN
  Administered 2021-09-30: 75 ug/kg/min via INTRAVENOUS

## 2021-09-30 MED ORDER — ONDANSETRON HCL 4 MG PO TABS: 4.0000 mg | ORAL_TABLET | Freq: Four times a day (QID) | ORAL | Status: DC | PRN

## 2021-09-30 MED ORDER — CITALOPRAM HYDROBROMIDE 20 MG PO TABS: 40.0000 mg | ORAL_TABLET | Freq: Every day | ORAL | Status: DC

## 2021-09-30 MED ORDER — SODIUM CHLORIDE 0.9 % IV SOLN
10.0000 mL/h | Freq: Once | INTRAVENOUS | Status: AC
  Administered 2021-09-30: 10 mL/h via INTRAVENOUS

## 2021-09-30 MED ORDER — ACETAMINOPHEN 650 MG RE SUPP: 650.0000 mg | Freq: Four times a day (QID) | RECTAL | Status: DC | PRN

## 2021-09-30 MED ORDER — SODIUM CHLORIDE 0.9 % IV SOLN
INTRAVENOUS | Status: DC
  Administered 2021-10-01: 12:00:00 1000 mL via INTRAVENOUS

## 2021-09-30 MED ORDER — PANTOPRAZOLE 80MG IVPB - SIMPLE MED
80.0000 mg | Freq: Once | INTRAVENOUS | Status: AC
  Administered 2021-09-30: 80 mg via INTRAVENOUS
  Filled 2021-09-30: qty 80

## 2021-09-30 MED ORDER — PEG 3350-KCL-NA BICARB-NACL 420 G PO SOLR
4000.0000 mL | Freq: Once | ORAL | Status: AC
  Administered 2021-09-30: 4000 mL via ORAL
  Filled 2021-09-30: qty 4000

## 2021-09-30 MED ORDER — ROPINIROLE HCL 1 MG PO TABS
2.0000 mg | ORAL_TABLET | Freq: Every day | ORAL | Status: DC
  Administered 2021-09-30 – 2021-10-02 (×3): 2 mg via ORAL
  Filled 2021-09-30 (×4): qty 2

## 2021-09-30 MED ORDER — SODIUM CHLORIDE 0.9 % IV BOLUS
1000.0000 mL | Freq: Once | INTRAVENOUS | Status: AC
  Administered 2021-09-30: 1000 mL via INTRAVENOUS

## 2021-09-30 MED ORDER — ACETAMINOPHEN 325 MG PO TABS
650.0000 mg | ORAL_TABLET | Freq: Four times a day (QID) | ORAL | Status: DC | PRN
  Administered 2021-09-30 – 2021-10-03 (×4): 650 mg via ORAL
  Filled 2021-09-30 (×4): qty 2

## 2021-09-30 NOTE — Consult Note (Signed)
GI Inpatient Consult Note  Reason for Consult: Acute blood loss anemia   Attending Requesting Consult: Dr. Shawna Clamp, MD  History of Present Illness: Amanda Brooks is a 70 y.o. female seen for evaluation of acute blood loss and symptomatic anemia at the request of admitting hospitalist - Dr. Dwyane Dee. Pt has a PMH of HTN, GERD, arthritis, anxiety, Hx of peptic ulcer disease, restless leg syndrome, and migraines. She presented to the [facility] ED via EMS from home this morning for chief complaint of 2-day hx of dark, tarry stools with associated generalized weakness, dizziness, and nausea. Upon presentation to the ED, patient was tachycardic (112), hypotensive with systolic in high [id number], and otherwise normal vital signs. Labs were significant for hemoglobin 7.0 which was decreased from 12.0 three weeks ago, hematocrit 21.8, MCV 95, INR 1.1, potassium 3.8, sodium 138, BUN 35, and serum creatinine 0.85. CTA performed this morning showed no evidence of active GI bleed and no acute findings. Per ED physician, rectal exam showed gross melanotic stool. GI was consulted for further evaluation and management.   Patient seen and examined this afternoon resting comfortably in ED stretcher. She reports the past two days have been miserable for her with progressive generalized weakness, fatigue, dizziness, and nausea. She is very pale appearing and feels exhausted. She denies any abdominal pain. There are no complaints of dysphagia or odynophagia. She reports the past two days she has had between 10-15 melanotic stools. She reports now it is basically just black water. Last episode was just earlier today about 1230 this afternoon. Last large melanotic stool was yesterday evening. She has received 2 units pRBCs and feels better since getting transfusion. She reports she takes PPI once daily at home and has been doing this for years. She follows as an outpatient with [facility] for IDA and receives IV iron  infusions. Last infusion was 06/22/21. She reports a history of peptic ulcer disease and duodenal stenosis. She saw Dr. Cristi Loron of Gastroenterology at [facility] earlier this year for follow-up. She has hx of NSAID-induced ulcers with last EGD 12/11/20 showing normal esophagus, large hiatal hernia, non-bleeding gastric ulcers with no stigmata of recent bleeding, and acquired duodenal stenosis. EGD 06/2020 by Dr. 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 She reports using BC powders and Naproxen prior to learning about her stomach ulcers and have strictly avoided them since. No history of GI bleeding. She does not take any anticoagulation or antiplatelet agents.   Past Medical History:  Past Medical History:  Diagnosis Date   Anxiety    Arthritis    GERD (gastroesophageal reflux disease)    Hypertension    Takes propranolol for HTN Does not see a cardiologist   Migraine headache    Restless leg syndrome    Vitamin D deficiency     Problem List: Patient Active Problem List   Diagnosis Date Noted   Acute GI bleeding 09/30/2021   Restless leg syndrome 04/02/2017   Encounter for medication refill 04/02/2017    Past Surgical History: Past Surgical History:  Procedure Laterality Date   APPENDECTOMY     done as part of tubal ligation   TUBAL LIGATION      Allergies: Allergies  Allergen Reactions   Percocet [Oxycodone-Acetaminophen] Nausea Only    Home Medications: (Not in a hospital admission)  Home medication reconciliation was completed with the  patient.   Scheduled Inpatient Medications:    propranolol ER  160 mg Oral Daily   rOPINIRole  2 mg Oral QHS   sodium chloride flush  3 mL Intravenous Q12H    Continuous Inpatient Infusions:    sodium chloride Stopped (09/30/21 0938)   sodium chloride 50 mL/hr at 09/30/21 1332   pantoprazole 8  mg/hr (09/30/21 1007)    PRN Inpatient Medications:  acetaminophen **OR** acetaminophen, ondansetron **OR** ondansetron (ZOFRAN) IV  Family History: family history is not on file.  The patient's family history is negative for inflammatory bowel disorders, GI malignancy, or solid organ transplantation.  Social History:   reports that she has never smoked. She has never used smokeless tobacco. She reports that she does not drink alcohol and does not use drugs. The patient denies ETOH, tobacco, or drug use.   Review of Systems: Constitutional: Weight is stable.  Eyes: No changes in vision. ENT: No oral lesions, sore throat.  GI: see HPI.  Heme/Lymph: No easy bruising.  CV: No chest pain.  GU: No hematuria.  Integumentary: No rashes.  Neuro: No headaches.  Psych: No depression/anxiety.  Endocrine: No heat/cold intolerance.  Allergic/Immunologic: No urticaria.  Resp: No cough, SOB.  Musculoskeletal: No joint swelling.    Physical Examination: BP 128/66   Pulse (!) 110   Temp (!) 97.5 F (36.4 C) (Rectal)   Resp 15   Ht 5\' 6"  (1.676 m)   Wt 68 kg   SpO2 100%   BMI 24.20 kg/m  Gen: NAD, alert and oriented x 4 HEENT: PEERLA, EOMI, Neck: supple, no JVD or thyromegaly Chest: CTA bilaterally, no wheezes, crackles, or other adventitious sounds CV: RRR, no m/g/c/r Abd: soft, NT, ND, +BS in all four quadrants; no HSM, guarding, ridigity, or rebound tenderness Ext: no edema, well perfused with 2+ pulses, Skin: no rash or lesions noted Lymph: no LAD  Data: Lab Results  Component Value Date   WBC 22.5 (H) 09/30/2021   HGB 7.0 (L) 09/30/2021   HCT 21.8 (L) 09/30/2021   MCV 95.2 09/30/2021   PLT 222 09/30/2021   Recent Labs  Lab 09/30/21 0857  HGB 7.0*   Lab Results  Component Value Date   NA 138 09/30/2021   K 3.8 09/30/2021   CL 111 09/30/2021   CO2 18 (L) 09/30/2021   BUN 35 (H) 09/30/2021   CREATININE 0.85 09/30/2021   Lab Results  Component Value Date   ALT  17 09/30/2021   AST 26 09/30/2021   ALKPHOS 52 09/30/2021   BILITOT 0.4 09/30/2021   Recent Labs  Lab 09/30/21 0857  INR 1.1   CT angio abd/pelvis with contrast 09/30/2021: IMPRESSION: VASCULAR   No evidence of active GI bleed. Unremarkable appearance of the vasculature of the abdomen and pelvis.   NON-VASCULAR   1. No acute findings in the abdomen or pelvis. 2. Moderate size hiatal hernia containing much of the gastric fundus.   Assessment/Plan:  70 y/o Caucasian female with a PMH of HTN, GERD, arthritis, anxiety, Hx of peptic ulcer disease, restless leg syndrome, and migraines presented to the [facility] ED this morning for acute blood loss and symptomatic anemia   Acute blood loss anemia/Symptomatic anemia/Melena - clinical presentation, labs, and history are consistent with upper GI bleed. DDx includes peptic ulcer disease, gastritis, erosive esophagitis, duodenitis, Dieulafoy's lesion, AVMs, occult neoplasm, Mallory-Weiss tear, etc. Hgb 7.0 upon presentation to the ED, which is a 5-gram drop compared to three weeks ago.   History  of peptic ulcer disease - last EGD 11/2020 at [facility] with Dr. Malissa Hippo. Gastric ulcers deemed most likely 2/2 NSAID use.   Acquired duodenal stenosis - seen on EGDs  Leukocytosis - likely reactive in setting of UGIB  Recommendations:  - Maintain 2 large bore Ivs - Continue to monitor H&H closely and transfuse for Hgb <7.0.  - Continue IV PPI for acid suppression - Advise EGD for luminal evaluation and likely endoscopic hemostasis this afternoon with Dr. Alice Reichert. Discussed procedure details and indications with patient and family present and they consent to proceed.  - Avoid NSAIDs - NPO  - See procedure note for findings and further recommendations   I reviewed the risks (including bleeding, perforation, infection, anesthesia complications, cardiac/respiratory complications), benefits and alternatives of EGD. Patient consents to proceed.     Thank you  for the consult. Please call with questions or concerns.  Reeves Forth [facility] [phone] (Cell)

## 2021-09-30 NOTE — ED Notes (Signed)
Lab at bedside drawing new type and screen

## 2021-09-30 NOTE — H&P (Addendum)
History and Physical    Amanda Brooks DOB: May 28, 1951 DOA: 09/30/2021  PCP: Pcp, No  Patient coming from:  Home  I have personally briefly reviewed patient's old medical records in [place]  Chief Complaint:  Profuse diarrhea followed by black stools.  HPI: Amanda Brooks is a 70 y.o. female with PMH significant of anxiety disorder, arthritis, gastric reflux, essential hypertension, peptic ulcer disease presented in the ED with complaints of generalized weakness and dizziness.  Patient reports having profuse diarrhea followed by black tarry stools for last 2 days.  Patient reports multiple episodes of dark tarry stools over the period of last 2 days. Patient reports fall in the bathroom in the morning yesterday but denies any head injury or trauma.  Daughter reported patient was unable to get out of bed in the morning today and She appeared pale, her systolic blood pressure was found to be in 80s.   Daughter reports patient had EGD twice at [facility] and both times was told that she has peptic ulcer disease without bleeding.  Patient has been taking Protonix at home regularly.  Patient denies any fever, chills, cough, recent travel, sick contact.  Patient denies any NSAIDs use.  ED Course: Patient was tachycardic, hypotensive other vitals were stable. HR 112, RR 18, BP 106/64, temp 97.5, SPO2 100% on room air. Labs: Sodium 138, potassium 3.8, chloride 111, bicarb 18, glucose 171, BUN 35, creatinine 0.85, calcium 8.3, anion gap 9, alkaline phosphatase 52, albumin 3.4, AST 26, ALT 17, total protein 6.1, total bilirubin 0.4, WBC 22.5, hemoglobin 7.0, hematocrit 21.8, MCV 95.2, platelet 222, PT 14.6, INR 1.1, CT abdomen and pelvis: No evidence of active GI bleed. Unremarkable appearance of the vasculature of the abdomen and pelvis. No acute findings in the abdomen or pelvis.  Review of Systems:  Review of Systems  Constitutional:  Positive for malaise/fatigue.  HENT: Negative.     Eyes: Negative.   Respiratory: Negative.    Cardiovascular: Negative.   Gastrointestinal:  Positive for abdominal pain, blood in stool, diarrhea, heartburn, melena and nausea.  Genitourinary: Negative.   Musculoskeletal: Negative.   Skin: Negative.   Neurological:  Positive for dizziness and weakness.  Endo/Heme/Allergies: Negative.   Psychiatric/Behavioral: Negative.      Past Medical History:  Diagnosis Date   Anxiety    Arthritis    GERD (gastroesophageal reflux disease)    Hypertension    Takes propranolol for HTN Does not see a cardiologist   Migraine headache    Restless leg syndrome    Vitamin D deficiency     Past Surgical History:  Procedure Laterality Date   APPENDECTOMY     done as part of tubal ligation   TUBAL LIGATION       reports that she has never smoked. She has never used smokeless tobacco. She reports that she does not drink alcohol and does not use drugs.  Allergies  Allergen Reactions   Percocet [Oxycodone-Acetaminophen] Nausea Only    History reviewed. No pertinent family history. Family history reviewed and not pertinent .   Prior to Admission medications   Medication Sig Start Date End Date Taking? Authorizing Provider  Cholecalciferol (VITAMIN D3) 2000 UNITS TABS Take 2,000 Units by mouth daily.    [provider]  citalopram (CELEXA) 40 MG tablet Take 40 mg by mouth daily.    [provider]  cyclobenzaprine (FLEXERIL) 10 MG tablet Take 1 tablet (10 mg total) by mouth 3 (three) times daily  as needed for muscle spasms. Patient not taking: Reported on 01/28/2018 02/25/13   Barnett Abu, MD  HYDROcodone-acetaminophen (NORCO/VICODIN) 5-325 MG per tablet Take 1-2 tablets by mouth every 4 (four) hours as needed for pain. Patient not taking: Reported on 01/28/2018 02/25/13   Barnett Abu, MD  meclizine (ANTIVERT) 25 MG tablet Take 1 tablet (25 mg total) by mouth 3 (three) times daily as needed for dizziness or nausea. 01/28/18    Sharman Cheek, MD  meloxicam (MOBIC) 15 MG tablet Take 15 mg by mouth daily.    [provider]  metoCLOPramide (REGLAN) 10 MG tablet Take 1 tablet (10 mg total) by mouth every 6 (six) hours as needed. 01/28/18   Sharman Cheek, MD  omeprazole (PRILOSEC) 20 MG capsule Take 20 mg by mouth daily.    [provider]  propranolol ER (INDERAL LA) 160 MG SR capsule Take 160 mg by mouth daily.    [provider]  rOPINIRole (REQUIP) 2 MG tablet Take 1 tablet (2 mg total) by mouth at bedtime. 04/02/17   Defelice, Para March, NP  SUMAtriptan (IMITREX) 100 MG tablet Take 100 mg by mouth every 2 (two) hours as needed for migraine.    [provider]  telmisartan (MICARDIS) 20 MG tablet Take 1 tablet by mouth daily. 01/20/18   [provider]    Physical Exam: Vitals:   09/30/21 1100 09/30/21 1130 09/30/21 1200 09/30/21 1230  BP: 117/61 113/66 (!) 131/53 122/64  Pulse: 98 81 (!) 109 (!) 114  Resp: 18 19 18 18   Temp:      TempSrc:      SpO2: 100% 98% 98% 99%  Weight:      Height:        Constitutional: Appears comfortable, not in any acute distress. Vitals:   09/30/21 1100 09/30/21 1130 09/30/21 1200 09/30/21 1230  BP: 117/61 113/66 (!) 131/53 122/64  Pulse: 98 81 (!) 109 (!) 114  Resp: 18 19 18 18   Temp:      TempSrc:      SpO2: 100% 98% 98% 99%  Weight:      Height:       Eyes: PERRL, lids and conjunctivae normal ENMT: Mucous membranes are moist.  No exudate, no erythema. Neck: normal, supple, no masses, no thyromegaly Respiratory: Clear to auscultation bilaterally, no wheezing, no crackles.  No accessory muscle use Cardiovascular: S1-S2 heard, regular rate and rhythm, no murmur.   Abdomen: Abdomen is soft, mild soreness, no distention, BS +. musculoskeletal: No edema, no clubbing, no cyanosis. No joint deformity upper and lower extremities. Good ROM, no contractures. Normal muscle tone.  Skin: no rashes, lesions, ulcers. No  induration Neurologic: CN 2-12 grossly intact. Sensation intact, DTR normal. Strength 5/5 in all 4.  Psychiatric: Normal judgment and insight. Alert and oriented x 3. Normal mood.     Labs on Admission: I have personally reviewed following labs and imaging studies  CBC: Recent Labs  Lab 09/30/21 0857  WBC 22.5*  NEUTROABS 18.0*  HGB 7.0*  HCT 21.8*  MCV 95.2  PLT 222   Basic Metabolic Panel: Recent Labs  Lab 09/30/21 0857  NA 138  K 3.8  CL 111  CO2 18*  GLUCOSE 171*  BUN 35*  CREATININE 0.85  CALCIUM 8.3*   GFR: Estimated Creatinine Clearance: 58.5 mL/min (by C-G formula based on SCr of 0.85 mg/dL). Liver Function Tests: Recent Labs  Lab 09/30/21 0857  AST 26  ALT 17  ALKPHOS 52  BILITOT 0.4  PROT 6.1*  ALBUMIN 3.4*   No results for input(s): LIPASE, AMYLASE in the last 168 hours. No results for input(s): AMMONIA in the last 168 hours. Coagulation Profile: Recent Labs  Lab 09/30/21 0857  INR 1.1   Cardiac Enzymes: No results for input(s): CKTOTAL, CKMB, CKMBINDEX, TROPONINI in the last 168 hours. BNP (last 3 results) No results for input(s): PROBNP in the last 8760 hours. HbA1C: No results for input(s): HGBA1C in the last 72 hours. CBG: No results for input(s): GLUCAP in the last 168 hours. Lipid Profile: No results for input(s): CHOL, HDL, LDLCALC, TRIG, CHOLHDL, LDLDIRECT in the last 72 hours. Thyroid Function Tests: No results for input(s): TSH, T4TOTAL, FREET4, T3FREE, THYROIDAB in the last 72 hours. Anemia Panel: No results for input(s): VITAMINB12, FOLATE, FERRITIN, TIBC, IRON, RETICCTPCT in the last 72 hours. Urine analysis: No results found for: COLORURINE, APPEARANCEUR, LABSPEC, [place], GLUCOSEU, HGBUR, BILIRUBINUR, KETONESUR, PROTEINUR, UROBILINOGEN, NITRITE, LEUKOCYTESUR  Radiological Exams on Admission: CT Angio Abd/Pel w/ and/or w/o  Result Date: 09/30/2021 CLINICAL DATA:  Concern for GI bleed. History of dark stools and ulcers.  EXAM: CTA ABDOMEN AND PELVIS WITHOUT AND WITH CONTRAST TECHNIQUE: Multidetector CT imaging of the abdomen and pelvis was performed using the standard protocol during bolus administration of intravenous contrast. Multiplanar reconstructed images and MIPs were obtained and reviewed to evaluate the vascular anatomy. CONTRAST:  125mL OMNIPAQUE IOHEXOL 350 MG/ML SOLN COMPARISON:  None. FINDINGS: VASCULAR Aorta: Normal caliber aorta without aneurysm, dissection, vasculitis or significant stenosis. Celiac: Patent without evidence of aneurysm, dissection, vasculitis or significant stenosis. SMA: Patent without evidence of aneurysm, dissection, vasculitis or significant stenosis. Renals: Both renal arteries are patent without evidence of aneurysm, dissection, vasculitis, fibromuscular dysplasia or significant stenosis. IMA: Patent without evidence of aneurysm, dissection, vasculitis or significant stenosis. Inflow: Patent without evidence of aneurysm, dissection, vasculitis or significant stenosis. Proximal outflow: Patent without evidence of aneurysm, dissection, vasculitis or significant stenosis. Veins: The main portal and splenic veins are patent on the venous phase images. There is no evidence of active GI bleed. Review of the MIP images confirms the above findings. NON-VASCULAR Lower chest: There is mild atelectasis in the left lung base. There is a small calcified granuloma in the right lung base. The imaged heart is unremarkable. Hepatobiliary: The liver and gallbladder are unremarkable. There is no biliary ductal dilatation. Pancreas: Unremarkable. Spleen: There is a lobulated hypodense lesion in the spleen measuring up to approximately 1.3 cm which may reflect a hemangioma. Adrenals/Urinary Tract: The adrenals are unremarkable. There is a parapelvic cyst in the left kidney measuring approximately 1.4 cm. There are no other focal lesions. There are no stones. There is no hydronephrosis or hydroureter. The bladder is  unremarkable. Stomach/Bowel: There is a moderate size hiatal hernia containing much of the gastric fundus. There is no evidence of obstruction. There is no evidence of small-bowel obstruction. There is no abnormal bowel wall thickening or inflammatory change. Lymphatic: There is no abdominal or pelvic lymphadenopathy. Reproductive: There is a small calcified uterine fibroid at the left aspect of the fundus. There is no adnexal mass. Other: There is no ascites or free air. Musculoskeletal: The patient is status post L4-L5 T left. There is disc space narrowing and vacuum disc phenomenon at L5-S1. The bones are diffusely demineralized. There is an intraosseous hemangioma in the L1 vertebral body. There is no acute osseous abnormality or aggressive osseous lesion. IMPRESSION: VASCULAR No evidence of active GI bleed. Unremarkable appearance of the vasculature of the abdomen  and pelvis. NON-VASCULAR 1. No acute findings in the abdomen or pelvis. 2. Moderate size hiatal hernia containing much of the gastric fundus. Electronically Signed   By: Valetta Mole M.D.   On: 09/30/2021 11:02    EKG:   EKG ordered.  Pending.  Assessment/Plan Principal Problem:   Acute GI bleeding Active Problems:   Restless leg syndrome   Acute Upper GI Bleeding: Patient presented with generalized weakness, fatigue, dizziness, palor, hypotension and black tarry stools for last 2 days. Last known Hb 12.0 three weeks back.  Hemoglobin on arrival 7.0. She appears obviously pale on exam,  black-colored stools noted on rectal exam. Patient has received 2 units of PRBCs.  We will follow posttransfusion hemoglobin. CTA abdomen: No evidence of  active GI bleed, no acute abnormality found. Continue Protonix IV infusion. Continue to monitor H&H every 6 hours. GI is consulted, will keep patient NPO,  she may require EGD. Her blood pressure has improved with IV hydration.  Acute blood loss anemia;  Sec. to above. She has dropped 5 g of  hemoglobin over the period of 3 weeks Patient received 2 units of PRBCs.  Follow post transfusion  hemoglobin. Continue to monitor H&H every 6 hours.   Leukocytosis: WBC 22.5 this could be reactive. Patient denies any fever, chills, cough. We will continue to monitor,  we will hold on antibiotics for now.   Essential HTN: Continue propranolol.  Restless leg syndrome. Continue requip.  DVT prophylaxis: SCDs Code Status: Full code. Family Communication: No family at bed side. Disposition Plan:    Status is: Inpatient  Remains inpatient appropriate because:  Acute GI Bleed requiring blood transfusion and GI evaluation  Anticipated DC home in few days.  Consults called: GI, Dr. Alice Reichert Admission status: Inpatient   Shawna Clamp MD [facility]   If 7PM-7AM, please contact night-coverage   09/30/2021, 1:12 PM

## 2021-09-30 NOTE — ED Provider Notes (Signed)
[facility] Emergency Department Provider Note  Time seen: 8:53 AM  I have reviewed the triage vital signs and the nursing notes.   HISTORY  Chief Complaint Dizziness and Nausea   HPI Amanda Brooks is a 70 y.o. female with a past medical history of anxiety, arthritis, gastric reflux, presents to the emergency department for dizziness and weakness.  Upon arrival patient is quite pale in appearance, states for the past 2 days she has been feeling nauseated weak and dizzy.  Denies any vomiting.  Does state dark loose stool over the past 2 days many episodes.  Patient states a history of a prior GI ulcer, not currently on any blood thinners per patient.  Patient denies any chest pain or shortness of breath.  Patient appears quite fatigued and is tachycardic.   Past Medical History:  Diagnosis Date   Anxiety    Arthritis    GERD (gastroesophageal reflux disease)    Hypertension    Takes propranolol for HTN Does not see a cardiologist   Migraine headache    Restless leg syndrome    Vitamin D deficiency     Patient Active Problem List   Diagnosis Date Noted   Restless leg syndrome 04/02/2017   Encounter for medication refill 04/02/2017    Past Surgical History:  Procedure Laterality Date   APPENDECTOMY     done as part of tubal ligation   TUBAL LIGATION      Prior to Admission medications   Medication Sig Start Date End Date Taking? Authorizing Provider  Cholecalciferol (VITAMIN D3) 2000 UNITS TABS Take 2,000 Units by mouth daily.    [provider]  citalopram (CELEXA) 40 MG tablet Take 40 mg by mouth daily.    [provider]  cyclobenzaprine (FLEXERIL) 10 MG tablet Take 1 tablet (10 mg total) by mouth 3 (three) times daily as needed for muscle spasms. Patient not taking: Reported on 01/28/2018 02/25/13   Barnett Abu, MD  HYDROcodone-acetaminophen (NORCO/VICODIN) 5-325 MG per tablet Take 1-2 tablets by mouth every 4 (four) hours as  needed for pain. Patient not taking: Reported on 01/28/2018 02/25/13   Barnett Abu, MD  meclizine (ANTIVERT) 25 MG tablet Take 1 tablet (25 mg total) by mouth 3 (three) times daily as needed for dizziness or nausea. 01/28/18   Sharman Cheek, MD  meloxicam (MOBIC) 15 MG tablet Take 15 mg by mouth daily.    [provider]  metoCLOPramide (REGLAN) 10 MG tablet Take 1 tablet (10 mg total) by mouth every 6 (six) hours as needed. 01/28/18   Sharman Cheek, MD  omeprazole (PRILOSEC) 20 MG capsule Take 20 mg by mouth daily.    [provider]  propranolol ER (INDERAL LA) 160 MG SR capsule Take 160 mg by mouth daily.    [provider]  rOPINIRole (REQUIP) 2 MG tablet Take 1 tablet (2 mg total) by mouth at bedtime. 04/02/17   Defelice, Para March, NP  SUMAtriptan (IMITREX) 100 MG tablet Take 100 mg by mouth every 2 (two) hours as needed for migraine.    [provider]  telmisartan (MICARDIS) 20 MG tablet Take 1 tablet by mouth daily. 01/20/18   [provider]    Allergies  Allergen Reactions   Percocet [Oxycodone-Acetaminophen] Nausea Only    No family history on file.  Social History Social History   Tobacco Use   Smoking status: Never   Smokeless tobacco: Never  Substance Use Topics   Alcohol use: No  Drug use: No    Review of Systems Constitutional: Negative for fever.  Positive for generalized weakness and fatigue. Cardiovascular: Negative for chest pain. Respiratory: Negative for shortness of breath. Gastrointestinal: Negative for abdominal pain.  Positive for nausea.  Negative for vomiting.  Positive for dark loose stool. Genitourinary: Negative for urinary compaints Musculoskeletal: Negative for musculoskeletal complaints Neurological: Negative for headache All other ROS negative  ____________________________________________   PHYSICAL EXAM:  VITAL SIGNS: ED Triage Vitals  Enc Vitals Group     BP 09/30/21 0846 106/64      Pulse Rate 09/30/21 0846 (!) 112     Resp 09/30/21 0846 18     Temp --      Temp src --      SpO2 --      Weight 09/30/21 0847 149 lb 14.6 oz (68 kg)     Height 09/30/21 0847 5\' 6"  (1.676 m)     Head Circumference --      Peak Flow --      Pain Score 09/30/21 0846 0     Pain Loc --      Pain Edu? --      Excl. in GC? --    Constitutional: Alert and oriented.  Pale in appearance Eyes: Normal exam ENT      Head: Normocephalic and atraumatic.      Mouth/Throat: Mucous membranes are moist. Cardiovascular: Normal rate, regular rhythm.  Respiratory: Normal respiratory effort without tachypnea nor retractions. Breath sounds are clear  Gastrointestinal: Soft and nontender. No distention.  Rectal examination shows melena strongly guaiac positive Musculoskeletal: Nontender with normal range of motion in all extremities. Neurologic:  Normal speech and language. No gross focal neurologic deficits Skin: Pale skin, pale mucous membranes. Psychiatric: Mood and affect are normal.   ____________________________________________   RADIOLOGY  CT negative for acute abnormality  ____________________________________________   INITIAL IMPRESSION / ASSESSMENT AND PLAN / ED COURSE  Pertinent labs & imaging results that were available during my care of the patient were reviewed by me and considered in my medical decision making (see chart for details).   Patient presents emergency department for generalized fatigue weakness dizziness.  Patient is very pale, with pale mucous membranes.  Patient states dark stool over the past 2 days.  Rectal examination positive for melena.  Patient is tachycardic symptomatic with pale appearance.  Clinically suspicious for very low hemoglobin.  We will transfuse 1 unit of emergency release blood preemptively while awaiting CBC results.  We will type and screen.  Anticipate the need for further transfusion.  Given the degree of bleeding we will also attempt to obtain a  CT scan to assess for active extravasation.  Hemoglobin currently 7.0 down from 12.5 previously.  CT scan largely nonrevealing.  Patient will be admitted to the hospital service for further work-up and treatment.  Attempted to consult GI medicine but did not receive a call back as of yet.  Amanda Brooks was evaluated in Emergency Department on 09/30/2021 for the symptoms described in the history of present illness. She was evaluated in the context of the global COVID-19 pandemic, which necessitated consideration that the patient might be at risk for infection with the SARS-CoV-2 virus that causes COVID-19. Institutional protocols and algorithms that pertain to the evaluation of patients at risk for COVID-19 are in a state of rapid change based on information released by regulatory bodies including the CDC and federal and state organizations. These policies and algorithms were followed during the  patient's care in the ED.  CRITICAL CARE Performed by: Minna Antis   Total critical care time: 45 minutes  Critical care time was exclusive of separately billable procedures and treating other patients.  Critical care was necessary to treat or prevent imminent or life-threatening deterioration.  Critical care was time spent personally by me on the following activities: development of treatment plan with patient and/or surrogate as well as nursing, discussions with consultants, evaluation of patient's response to treatment, examination of patient, obtaining history from patient or surrogate, ordering and performing treatments and interventions, ordering and review of laboratory studies, ordering and review of radiographic studies, pulse oximetry and re-evaluation of patient's condition.  ____________________________________________   FINAL CLINICAL IMPRESSION(S) / ED DIAGNOSES  GI bleed Symptomatic anemia   Minna Antis, MD 09/30/21 1433

## 2021-09-30 NOTE — ED Notes (Signed)
Assisted pt to toilet to attempt to defecate. Pt urinated and had very small amount of hematochezia.

## 2021-09-30 NOTE — ED Notes (Signed)
Blood bank reported that crossmatch was compatible with the emergency blood units used. EDP notified.

## 2021-09-30 NOTE — OR Nursing (Signed)
Patient accepted into recovery.  Vitals taken. Patient passed gas and had a moderate amount dark bloody red stool.   Dr Norma Fredrickson notified.  Patient cleaned up. Eating ice chips per Dr Norma Fredrickson

## 2021-09-30 NOTE — ED Triage Notes (Signed)
Pt from home with c/o dizziness and nausea since yesterday, brought in by [facility]. Pt is pale, diaphoretic, and cool to the touch. She reports that she is dizzy all the time

## 2021-09-30 NOTE — Anesthesia Postprocedure Evaluation (Signed)
Anesthesia Post Note  Patient: Amanda Brooks  Procedure(s) Performed: ESOPHAGOGASTRODUODENOSCOPY (EGD)  Patient location during evaluation: Endoscopy Anesthesia Type: General Level of consciousness: awake and alert Pain management: pain level controlled Vital Signs Assessment: post-procedure vital signs reviewed and stable Respiratory status: spontaneous breathing, nonlabored ventilation, respiratory function stable and patient connected to nasal cannula oxygen Cardiovascular status: blood pressure returned to baseline and stable Postop Assessment: no apparent nausea or vomiting Anesthetic complications: no   No notable events documented.   Last Vitals:  Vitals:   09/30/21 1603 09/30/21 1619  BP: (!) 112/55 110/69  Pulse: 90 94  Resp: 17 16  Temp:    SpO2: 99% 100%    Last Pain:  Vitals:   09/30/21 1619  TempSrc:   PainSc: 0-No pain                 Cleda Mccreedy Akasia Ahmad

## 2021-09-30 NOTE — ED Notes (Signed)
Endoscopy at bedside to transport

## 2021-09-30 NOTE — Plan of Care (Signed)

## 2021-09-30 NOTE — ED Notes (Signed)
Type an screen collected and sent with lab personnel at this time. Pt taken to CT at this time.

## 2021-09-30 NOTE — Transfer of Care (Signed)
Immediate Anesthesia Transfer of Care Note  Patient: Amanda Brooks  Procedure(s) Performed: ESOPHAGOGASTRODUODENOSCOPY (EGD)  Patient Location: Endoscopy Unit  Anesthesia Type:General  Level of Consciousness: drowsy and patient cooperative  Airway & Oxygen Therapy: Patient Spontanous Breathing and Patient connected to nasal cannula oxygen  Post-op Assessment: Report given to RN and Post -op Vital signs reviewed and unstable, Anesthesiologist notified  Post vital signs: Reviewed and unstable  Last Vitals:  Vitals Value Taken Time  BP 112/55 09/30/21 1603  Temp    Pulse 90 09/30/21 1603  Resp 17 09/30/21 1603  SpO2 99 % 09/30/21 1603    Last Pain:  Vitals:   09/30/21 1559  TempSrc:   PainSc: Asleep      Patients Stated Pain Goal: 0 (09/30/21 1518)  Complications: No notable events documented.

## 2021-09-30 NOTE — Op Note (Addendum)
[facility] Patient Name: Amanda Brooks Procedure Date: 09/30/2021 3:03 PM MRN: [id number] Account #: [id number] Date of Birth: May 02, 1951 Admit Type: Inpatient Age: 70 Room: [facility] ENDO ROOM 2 Gender: Female Note Status: Finalized Instrument Name: Upper Endoscope [id number] Procedure:             Upper GI endoscopy Indications:           Acute post hemorrhagic anemia, Melena, Personal                         history of peptic ulcer disease Providers:             Boykin Nearing. Norma Fredrickson MD, MD Referring MD:          No Local Md, MD (Referring MD) Medicines:             Propofol per Anesthesia Complications:         No immediate complications. Procedure:             Pre-Anesthesia Assessment:                        - The risks and benefits of the procedure and the                         sedation options and risks were discussed with the                         patient. All questions were answered and informed                         consent was obtained.                        - Patient identification and proposed procedure were                         verified prior to the procedure by the nurse. The                         procedure was verified in the procedure room.                        - ASA Grade Assessment: III - A patient with severe                         systemic disease.                        - After reviewing the risks and benefits, the patient                         was deemed in satisfactory condition to undergo the                         procedure.                        - The risks and benefits of the procedure and the  sedation options and risks were discussed with the                         patient. All questions were answered and informed                         consent was obtained.                        - Patient identification and proposed procedure were                         verified prior to the  procedure by the nurse. The                         procedure was verified in the procedure room.                        After obtaining informed consent, the endoscope was                         passed under direct vision. Throughout the procedure,                         the patient's blood pressure, pulse, and oxygen                         saturations were monitored continuously. The Endoscope                         was introduced through the mouth, and advanced to the                         third part of duodenum. The upper GI endoscopy was                         accomplished without difficulty. The patient tolerated                         the procedure well. Findings:      The examined esophagus was normal.      A large hiatal hernia was present.      Multiple dispersed erosions with no bleeding and no stigmata of recent       bleeding were found in the cardia and in the gastric fundus.      Scattered moderate inflammation characterized by erosions was found in       the gastric antrum. Biopsies were taken with a cold forceps for       Helicobacter pylori testing.      Three non-bleeding superficial gastric ulcers with no stigmata of       bleeding were found in the prepyloric region of the stomach. The largest       lesion was 5 mm in largest dimension.      One non-bleeding cratered duodenal ulcer with no stigmata of bleeding       was found in the duodenal bulb. The lesion was 6 mm in largest dimension.      An acquired benign-appearing, intrinsic mild stenosis was found in the  first portion of the duodenum and was traversed.      The second portion of the duodenum and third portion of the duodenum       were normal.      The exam was otherwise without abnormality. Impression:            - Normal esophagus.                        - Large hiatal hernia.                        - Erosive gastropathy with no bleeding and no stigmata                         of recent  bleeding.                        - Gastritis. Biopsied.                        - Non-bleeding gastric ulcers with no stigmata of                         bleeding.                        - Non-bleeding duodenal ulcer with no stigmata of                         bleeding.                        - Acquired duodenal stenosis.                        - Normal second portion of the duodenum and third                         portion of the duodenum.                        - The examination was otherwise normal. Recommendation:        - Continue present medications.                        - Serial H/H, Serial exams                        - Clear liquid diet.                        - Perform a colonoscopy tomorrow. Procedure Code(s):     --- Professional ---                        [phone], Esophagogastroduodenoscopy, flexible,                         transoral; with biopsy, single or multiple Diagnosis Code(s):     --- Professional ---                        Z87.11, Personal history of peptic ulcer disease  K92.1, Melena (includes Hematochezia)                        D62, Acute posthemorrhagic anemia                        K31.5, Obstruction of duodenum                        K26.9, Duodenal ulcer, unspecified as acute or                         chronic, without hemorrhage or perforation                        K25.9, Gastric ulcer, unspecified as acute or chronic,                         without hemorrhage or perforation                        K29.70, Gastritis, unspecified, without bleeding                        K31.89, Other diseases of stomach and duodenum                        K44.9, Diaphragmatic hernia without obstruction or                         gangrene CPT copyright 2019 American Medical Association. All rights reserved. The codes documented in this report are preliminary and upon coder review may  be revised to meet current compliance requirements. Stanton Kidney MD, MD 09/30/2021 4:09:39 PM This report has been signed electronically. Number of Addenda: 0 Note Initiated On: 09/30/2021 3:03 PM Estimated Blood Loss:  Estimated blood loss: none. Estimated blood loss: none.      [facility]

## 2021-09-30 NOTE — ED Notes (Signed)
Family reported to MD that pt fell yesterday. Due to fall and hemodynamic status, updated fall risk assessment.

## 2021-09-30 NOTE — Anesthesia Preprocedure Evaluation (Signed)
Anesthesia Evaluation  Patient identified by MRN, date of birth, ID band Patient awake    Reviewed: Allergy & Precautions, NPO status , Patient's Chart, lab work & pertinent test results  Airway Mallampati: III  TM Distance: <3 FB Neck ROM: full    Dental  (+) Chipped   Pulmonary neg pulmonary ROS,    Pulmonary exam normal        Cardiovascular hypertension, (-) angina(-) Past MI and (-) DOE + dysrhythmias Atrial Fibrillation  Rhythm:irregular Rate:Tachycardia     Neuro/Psych  Headaches, PSYCHIATRIC DISORDERS    GI/Hepatic Neg liver ROS, GERD  Medicated and Controlled,  Endo/Other  negative endocrine ROS  Renal/GU negative Renal ROS  negative genitourinary   Musculoskeletal   Abdominal   Peds  Hematology negative hematology ROS (+)   Anesthesia Other Findings Past Medical History: No date: Anxiety No date: Arthritis No date: GERD (gastroesophageal reflux disease) No date: Hypertension     Comment:  Takes propranolol for HTN Does not see a cardiologist No date: Migraine headache No date: Restless leg syndrome No date: Vitamin D deficiency  Past Surgical History: No date: APPENDECTOMY     Comment:  done as part of tubal ligation No date: TUBAL LIGATION  BMI    Body Mass Index: 24.20 kg/m      Reproductive/Obstetrics negative OB ROS                             Anesthesia Physical Anesthesia Plan  ASA: 3  Anesthesia Plan: General   Post-op Pain Management:    Induction: Intravenous  PONV Risk Score and Plan: Propofol infusion and TIVA  Airway Management Planned: Natural Airway and Nasal Cannula  Additional Equipment:   Intra-op Plan:   Post-operative Plan:   Informed Consent: I have reviewed the patients History and Physical, chart, labs and discussed the procedure including the risks, benefits and alternatives for the proposed anesthesia with the patient or  authorized representative who has indicated his/her understanding and acceptance.     Dental Advisory Given  Plan Discussed with: Anesthesiologist, CRNA and Surgeon  Anesthesia Plan Comments: (Patient consented for risks of anesthesia including but not limited to:  - adverse reactions to medications - risk of airway placement if required - damage to eyes, teeth, lips or other oral mucosa - nerve damage due to positioning  - sore throat or hoarseness - Damage to heart, brain, nerves, lungs, other parts of body or loss of life  Patient voiced understanding.)        Anesthesia Quick Evaluation

## 2021-09-30 NOTE — ED Notes (Signed)
Assisted patient to in room toilet due to need to defecate. No abdominal pain reported at baseline but was tender upon palpation by MD.

## 2021-09-30 NOTE — ED Notes (Signed)
Pt reported being warm. Rechecked oral temp and was 98.4. Discontinued her bairhugger and covered with warm blankets only at this time.

## 2021-09-30 NOTE — ED Triage Notes (Signed)
Pt states that she has had dark stools for the last two days also has hx of ulcers is not on blood thinners. MD at bedside

## 2021-09-30 NOTE — ED Notes (Signed)
This RN witnessed this pt Aox4 give verbal consent at this time to MD [facility] for the administration of blood products. Emergent situation, unable to obtain physical consent form at this time.

## 2021-09-30 NOTE — H&P (View-Only) (Signed)
GI Inpatient Consult Note  Reason for Consult: Acute blood loss anemia   Attending Requesting Consult: Dr. Shawna Clamp, MD  History of Present Illness: Amanda Brooks is a 70 y.o. female seen for evaluation of acute blood loss and symptomatic anemia at the request of admitting hospitalist - Dr. Dwyane Dee. Pt has a PMH of HTN, GERD, arthritis, anxiety, Hx of peptic ulcer disease, restless leg syndrome, and migraines. She presented to the [facility] ED via EMS from home this morning for chief complaint of 2-day hx of dark, tarry stools with associated generalized weakness, dizziness, and nausea. Upon presentation to the ED, patient was tachycardic (112), hypotensive with systolic in high [id number], and otherwise normal vital signs. Labs were significant for hemoglobin 7.0 which was decreased from 12.0 three weeks ago, hematocrit 21.8, MCV 95, INR 1.1, potassium 3.8, sodium 138, BUN 35, and serum creatinine 0.85. CTA performed this morning showed no evidence of active GI bleed and no acute findings. Per ED physician, rectal exam showed gross melanotic stool. GI was consulted for further evaluation and management.   Patient seen and examined this afternoon resting comfortably in ED stretcher. She reports the past two days have been miserable for her with progressive generalized weakness, fatigue, dizziness, and nausea. She is very pale appearing and feels exhausted. She denies any abdominal pain. There are no complaints of dysphagia or odynophagia. She reports the past two days she has had between 10-15 melanotic stools. She reports now it is basically just black water. Last episode was just earlier today about 1230 this afternoon. Last large melanotic stool was yesterday evening. She has received 2 units pRBCs and feels better since getting transfusion. She reports she takes PPI once daily at home and has been doing this for years. She follows as an outpatient with [facility] for I[facility] and receives IV iron  infusions. Last infusion was 06/22/21. She reports a history of peptic ulcer disease and duodenal stenosis. She saw Dr. Cristi Loron of Gastroenterology at [facility] earlier this year for follow-up. She has hx of NSAID-induced ulcers with last EGD 12/11/20 showing normal esophagus, large hiatal hernia, non-bleeding gastric ulcers with no stigmata of recent bleeding, and acquired duodenal stenosis. EGD 06/2020 by Dr. 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 She reports using BC powders and Naproxen prior to learning about her stomach ulcers and have strictly avoided them since. No history of GI bleeding. She does not take any anticoagulation or antiplatelet agents.   Past Medical History:  Past Medical History:  Diagnosis Date   Anxiety    Arthritis    GERD (gastroesophageal reflux disease)    Hypertension    Takes propranolol for HTN Does not see a cardiologist   Migraine headache    Restless leg syndrome    Vitamin D deficiency     Problem List: Patient Active Problem List   Diagnosis Date Noted   Acute GI bleeding 09/30/2021   Restless leg syndrome 04/02/2017   Encounter for medication refill 04/02/2017    Past Surgical History: Past Surgical History:  Procedure Laterality Date   APPENDECTOMY     done as part of tubal ligation   TUBAL LIGATION      Allergies: Allergies  Allergen Reactions   Percocet [Oxycodone-Acetaminophen] Nausea Only    Home Medications: (Not in a hospital admission)  Home medication reconciliation was completed with the  patient.   Scheduled Inpatient Medications:    propranolol ER  160 mg Oral Daily   rOPINIRole  2 mg Oral QHS   sodium chloride flush  3 mL Intravenous Q12H    Continuous Inpatient Infusions:    sodium chloride Stopped (09/30/21 0938)   sodium chloride 50 mL/hr at 09/30/21 1332   pantoprazole 8  mg/hr (09/30/21 1007)    PRN Inpatient Medications:  acetaminophen **OR** acetaminophen, ondansetron **OR** ondansetron (ZOFRAN) IV  Family History: family history is not on file.  The patient's family history is negative for inflammatory bowel disorders, GI malignancy, or solid organ transplantation.  Social History:   reports that she has never smoked. She has never used smokeless tobacco. She reports that she does not drink alcohol and does not use drugs. The patient denies ETOH, tobacco, or drug use.   Review of Systems: Constitutional: Weight is stable.  Eyes: No changes in vision. ENT: No oral lesions, sore throat.  GI: see HPI.  Heme/Lymph: No easy bruising.  CV: No chest pain.  GU: No hematuria.  Integumentary: No rashes.  Neuro: No headaches.  Psych: No depression/anxiety.  Endocrine: No heat/cold intolerance.  Allergic/Immunologic: No urticaria.  Resp: No cough, SOB.  Musculoskeletal: No joint swelling.    Physical Examination: BP 128/66   Pulse (!) 110   Temp (!) 97.5 F (36.4 C) (Rectal)   Resp 15   Ht 5\' 6"  (1.676 m)   Wt 68 kg   SpO2 100%   BMI 24.20 kg/m  Gen: NAD, alert and oriented x 4 HEENT: PEERLA, EOMI, Neck: supple, no JVD or thyromegaly Chest: CTA bilaterally, no wheezes, crackles, or other adventitious sounds CV: RRR, no m/g/c/r Abd: soft, NT, ND, +BS in all four quadrants; no HSM, guarding, ridigity, or rebound tenderness Ext: no edema, well perfused with 2+ pulses, Skin: no rash or lesions noted Lymph: no LAD  Data: Lab Results  Component Value Date   WBC 22.5 (H) 09/30/2021   HGB 7.0 (L) 09/30/2021   HCT 21.8 (L) 09/30/2021   MCV 95.2 09/30/2021   PLT 222 09/30/2021   Recent Labs  Lab 09/30/21 0857  HGB 7.0*   Lab Results  Component Value Date   NA 138 09/30/2021   K 3.8 09/30/2021   CL 111 09/30/2021   CO2 18 (L) 09/30/2021   BUN 35 (H) 09/30/2021   CREATININE 0.85 09/30/2021   Lab Results  Component Value Date   ALT  17 09/30/2021   AST 26 09/30/2021   ALKPHOS 52 09/30/2021   BILITOT 0.4 09/30/2021   Recent Labs  Lab 09/30/21 0857  INR 1.1   CT angio abd/pelvis with contrast 09/30/2021: IMPRESSION: VASCULAR   No evidence of active GI bleed. Unremarkable appearance of the vasculature of the abdomen and pelvis.   NON-VASCULAR   1. No acute findings in the abdomen or pelvis. 2. Moderate size hiatal hernia containing much of the gastric fundus.   Assessment/Plan:  70 y/o Caucasian female with a PMH of HTN, GERD, arthritis, anxiety, Hx of peptic ulcer disease, restless leg syndrome, and migraines presented to the [facility] ED this morning for acute blood loss and symptomatic anemia   Acute blood loss anemia/Symptomatic anemia/Melena - clinical presentation, labs, and history are consistent with upper GI bleed. DDx includes peptic ulcer disease, gastritis, erosive esophagitis, duodenitis, Dieulafoy's lesion, AVMs, occult neoplasm, Mallory-Weiss tear, etc. Hgb 7.0 upon presentation to the ED, which is a 5-gram drop compared to three weeks ago.   History  of peptic ulcer disease - last EGD 11/[facility] at [facility] with Dr. Malissa Hippo. Gastric ulcers deemed most likely 2/2 NSAID use.   Acquired duodenal stenosis - seen on EGDs  Leukocytosis - likely reactive in setting of UGIB  Recommendations:  - Maintain 2 large bore Ivs - Continue to monitor H&H closely and transfuse for Hgb <7.0.  - Continue IV PPI for acid suppression - Advise EGD for luminal evaluation and likely endoscopic hemostasis this afternoon with Dr. Alice Reichert. Discussed procedure details and indications with patient and family present and they consent to proceed.  - Avoid NSAIDs - NPO  - See procedure note for findings and further recommendations   I reviewed the risks (including bleeding, perforation, infection, anesthesia complications, cardiac/respiratory complications), benefits and alternatives of EGD. Patient consents to proceed.     Thank you  for the consult. Please call with questions or concerns.  Reeves Forth [facility] [phone] (Cell)

## 2021-09-30 NOTE — ED Notes (Signed)
BBID [id number] applied to this pt's L wrist above ID band at this time.

## 2021-10-01 ENCOUNTER — Encounter: Payer: Self-pay | Admitting: Internal Medicine

## 2021-10-01 ENCOUNTER — Inpatient Hospital Stay: Payer: Medicare Other | Admitting: Registered Nurse

## 2021-10-01 ENCOUNTER — Encounter
Admission: EM | Disposition: A | Discharge status: Home / Self Care | Payer: Self-pay | Source: Home / Self Care | Attending: Internal Medicine

## 2021-10-01 DIAGNOSIS — K922 Gastrointestinal hemorrhage, unspecified: Secondary | ICD-10-CM | POA: Diagnosis not present

## 2021-10-01 HISTORY — PX: COLONOSCOPY: SHX5424

## 2021-10-01 LAB — CBC
HCT: 24.2 % — ABNORMAL LOW (ref 36.0–46.0)
Hemoglobin: 8.3 g/dL — ABNORMAL LOW (ref 12.0–15.0)
MCH: 31.2 pg (ref 26.0–34.0)
MCHC: 34.3 g/dL (ref 30.0–36.0)
MCV: 91 fL (ref 80.0–100.0)
Platelets: 159 10*3/uL (ref 150–400)
RBC: 2.66 MIL/uL — ABNORMAL LOW (ref 3.87–5.11)
RDW: 15.3 % (ref 11.5–15.5)
WBC: 10.6 10*3/uL — ABNORMAL HIGH (ref 4.0–10.5)
nRBC: 0.2 % (ref 0.0–0.2)

## 2021-10-01 LAB — COMPREHENSIVE METABOLIC PANEL
ALT: 17 U/L (ref 0–44)
AST: 25 U/L (ref 15–41)
Albumin: 2.7 g/dL — ABNORMAL LOW (ref 3.5–5.0)
Alkaline Phosphatase: 47 U/L (ref 38–126)
Anion gap: 8 (ref 5–15)
BUN: 15 mg/dL (ref 8–23)
CO2: 22 mmol/L (ref 22–32)
Calcium: 7.7 mg/dL — ABNORMAL LOW (ref 8.9–10.3)
Chloride: 113 mmol/L — ABNORMAL HIGH (ref 98–111)
Creatinine, Ser: 0.58 mg/dL (ref 0.44–1.00)
GFR, Estimated: >60 mL/min (ref >60)
Glucose, Bld: 117 mg/dL — ABNORMAL HIGH (ref 70–99)
Potassium: 3.5 mmol/L (ref 3.5–5.1)
Sodium: 143 mmol/L (ref 135–145)
Total Bilirubin: 0.5 mg/dL (ref 0.3–1.2)
Total Protein: 5.3 g/dL — ABNORMAL LOW (ref 6.5–8.1)

## 2021-10-01 LAB — APTT: aPTT: 27 seconds (ref 24–36)

## 2021-10-01 LAB — PROTIME-INR
INR: 1.2 (ref 0.8–1.2)
Prothrombin Time: 15.5 seconds — ABNORMAL HIGH (ref 11.4–15.2)

## 2021-10-01 LAB — HEMOGLOBIN AND HEMATOCRIT, BLOOD
HCT: 26.4 % — ABNORMAL LOW (ref 36.0–46.0)
HCT: 24 % — ABNORMAL LOW (ref 36.0–46.0)
HCT: 22.8 % — ABNORMAL LOW (ref 36.0–46.0)
Hemoglobin: 8.9 g/dL — ABNORMAL LOW (ref 12.0–15.0)
Hemoglobin: 8.2 g/dL — ABNORMAL LOW (ref 12.0–15.0)
Hemoglobin: 7.7 g/dL — ABNORMAL LOW (ref 12.0–15.0)

## 2021-10-01 LAB — PHOSPHORUS: Phosphorus: 2.8 mg/dL (ref 2.5–4.6)

## 2021-10-01 LAB — MAGNESIUM: Magnesium: 2.1 mg/dL (ref 1.7–2.4)

## 2021-10-01 SURGERY — COLONOSCOPY
Anesthesia: General

## 2021-10-01 MED ORDER — PROPOFOL 10 MG/ML IV BOLUS
INTRAVENOUS | Status: DC | PRN
  Administered 2021-10-01: 80 mg via INTRAVENOUS
  Administered 2021-10-01: 10 mg via INTRAVENOUS

## 2021-10-01 MED ORDER — PROPOFOL 500 MG/50ML IV EMUL
INTRAVENOUS | Status: DC | PRN
  Administered 2021-10-01: 150 ug/kg/min via INTRAVENOUS

## 2021-10-01 MED ORDER — PANTOPRAZOLE SODIUM 40 MG IV SOLR
40.0000 mg | Freq: Two times a day (BID) | INTRAVENOUS | Status: DC
  Administered 2021-10-01 – 2021-10-03 (×4): 40 mg via INTRAVENOUS
  Filled 2021-10-01 (×4): qty 40

## 2021-10-01 MED ORDER — LIDOCAINE HCL (CARDIAC) PF 100 MG/5ML IV SOSY
PREFILLED_SYRINGE | INTRAVENOUS | Status: DC | PRN
  Administered 2021-10-01: 40 mg via INTRAVENOUS

## 2021-10-01 MED ORDER — PANTOPRAZOLE SODIUM 40 MG IV SOLR: 40.0000 mg | Freq: Two times a day (BID) | INTRAVENOUS | Status: DC

## 2021-10-01 MED ORDER — PANTOPRAZOLE INFUSION (NEW) - SIMPLE MED
8.0000 mg/h | INTRAVENOUS | Status: DC
  Filled 2021-10-01: qty 100

## 2021-10-01 MED ORDER — PROPOFOL 500 MG/50ML IV EMUL
INTRAVENOUS | Status: AC
  Filled 2021-10-01: qty 50

## 2021-10-01 MED ORDER — PANTOPRAZOLE SODIUM 40 MG PO TBEC: 80.0000 mg | DELAYED_RELEASE_TABLET | Freq: Every day | ORAL | Status: DC

## 2021-10-01 MED ORDER — PANTOPRAZOLE 80MG IVPB - SIMPLE MED
80.0000 mg | Freq: Once | INTRAVENOUS | Status: DC
  Filled 2021-10-01: qty 100

## 2021-10-01 NOTE — Progress Notes (Signed)
PROGRESS NOTE    Amanda Brooks   [id number]  DOB: 02-Jun-1951  PCP: Pcp, No    DOA: 09/30/2021 LOS: 1    Brief Narrative / Hospital Course to Date:   70 y.o. female with PMH significant of anxiety disorder, arthritis, gastric reflux, essential hypertension, peptic ulcer disease presented in the ED with complaints of generalized weakness and dizziness, in addition to diarrhea followed by black tarry stools for x 2 days.  Generalized weakness progressed to the point patient was unable to get out of bed morning of admission and patient was noted to be pale and found hypotensive with systolic BP in the [id number].  Patient was hypotensive and tachycardic in the ED.  Hemoglobin was found to be 7.0, down from 12.0 three weeks ago.  CTA abdomen/pelvis was negative for evidence of active GI bleeding with unremarkable appearance of the vasculature of the abdomen pelvis.  Patient was admitted to [facility] service with GI consulted, transfused 2 units packed RBCs with improvement in hemoglobin to 10.1.  Started on Protonix drip.  Patient was taken for EGD on the afternoon of admission.  It showed normal esophagus, large hiatal hernia, erosive gastropathy with no bleeding or stigmata of recent bleeding, gastritis which was biopsied, nonbleeding gastric and duodenal ulcers also with no stigmata of recent bleeding.  Patient then underwent bowel prep and was taken for colonoscopy on 11/17.  It showed nonbleeding internal hemorrhoids, normal mucosa of the entire examined colon.  Assessment & Plan   Principal Problem:   Acute GI bleeding Active Problems:   Restless leg syndrome   Acute blood loss anemia due to GI bleeding -no definitive source of bleeding was found on EGD or colonoscopy.  Is possible her duodenal or gastric ulcers were bleeding previously, versus small bowel source such as AVM. Presented with hemoglobin 7.0, improved to 10.1 after 2 units PRBCs transfused. 11/17: Hemoglobin down to 8.3,  stable this afternoon at 8.2. --Continue trending hemoglobin and transfuse if further decline or if less than 7 --GI recommends outpatient follow-up in 2 months --Diet resumed --Continue IV PPI and transition to oral twice daily at discharge  -- Stable to transfer to [place] with telemetry --Okay to stop maintenance hydration once tolerating diet  Leukocytosis -most likely reactive in the setting of bleeding.  Patient is afebrile and without signs or symptoms of infection. --Monitor CBC --Monitor for signs symptoms of infection  Essential hypertension -continue home propranolol  Restless leg syndrome -continue home Requip    Patient BMI: Body mass index is 25.73 kg/m.   DVT prophylaxis: SCDs Start: 09/30/21 1225   Diet:  Diet Orders (From admission, onward)     Start     Ordered   10/01/21 1501  Diet regular Room service appropriate? Yes; Fluid consistency: Thin  Diet effective now       Question Answer Comment  Room service appropriate? Yes   Fluid consistency: Thin      10/01/21 1500              Code Status: Full Code   Subjective 10/01/21    Patient seen in ICU this morning prior to colonoscopy.  Husband and 2 daughters are at bedside.  She reports feeling well, completed her bowel prep.  Denies any abdominal pain nausea or vomiting.  Says she still having some black appearing liquid stool, not completely clear.  No other acute complaints just feels very weak and tired with minimal exertion.   Disposition Plan & Communication  Status is: Inpatient  Remains inpatient appropriate because: Warrants further close monitoring of hemoglobin in case for further need of transfusion with two-point drop in hemoglobin in the last 24 hours after transfusions in setting of GI bleeding.    Family Communication: Husband and 2 daughters at bedside on rounds today 11/17.   Consults, Procedures, Significant Events   Consultants:  Gastroenterology  Procedures:  EGD  11/16 Colonoscopy 11/17  Antimicrobials:  Anti-infectives (From admission, onward)    None         Micro    Objective   Vitals:   10/01/21 1100 10/01/21 1330 10/01/21 1449 10/01/21 1500  BP: 116/60 (!) 97/44 120/68 132/67  Pulse:  76 79   Resp: 19 19 17 15   Temp:  (!) 97 F (36.1 C)    TempSrc:  Temporal    SpO2:  100% 99%   Weight:      Height:        Intake/Output Summary (Last 24 hours) at 10/01/2021 1545 Last data filed at 10/01/2021 1500 Gross per 24 hour  Intake 1709.39 ml  Output 300 ml  Net 1409.39 ml   Filed Weights   09/30/21 0847 09/30/21 1800  Weight: 68 kg 72.3 kg    Physical Exam:  General exam: awake, alert, no acute distress HEENT: Pale conjunctiva, anicteric sclera, moist mucus membranes, hearing grossly normal  Respiratory system: CTAB with diminished bases, no wheezes, rales or rhonchi, normal respiratory effort. Cardiovascular system: normal S1/S2, RRR, no pedal edema.   Gastrointestinal system: soft, nontender nondistended4 Central nervous system: A&O x4. no gross focal neurologic deficits, normal speech Extremities: moves all, no edema, normal tone Skin: Pale, dry, intact, normal temperature Psychiatry: normal mood, congruent affect, judgement and insight appear normal  Labs   Data Reviewed: I have personally reviewed following labs and imaging studies  CBC: Recent Labs  Lab 09/30/21 0857 09/30/21 1421 09/30/21 1942 10/01/21 0114 10/01/21 0709 10/01/21 1502  WBC 22.5*  --   --   --  10.6*  --   NEUTROABS 18.0*  --   --   --   --   --   HGB 7.0* 10.1* 9.6* 8.9* 8.3* 8.2*  HCT 21.8* 29.4* 27.8* 26.4* 24.2* 24.0*  MCV 95.2  --   --   --  91.0  --   PLT 222  --   --   --  159  --    Basic Metabolic Panel: Recent Labs  Lab 09/30/21 0857 10/01/21 0709  NA 138 143  K 3.8 3.5  CL 111 113*  CO2 18* 22  GLUCOSE 171* 117*  BUN 35* 15  CREATININE 0.85 0.58  CALCIUM 8.3* 7.7*  MG  --  2.1  PHOS  --  2.8    GFR: Estimated Creatinine Clearance: 67.6 mL/min (by C-G formula based on SCr of 0.58 mg/dL). Liver Function Tests: Recent Labs  Lab 09/30/21 0857 10/01/21 0709  AST 26 25  ALT 17 17  ALKPHOS 52 47  BILITOT 0.4 0.5  PROT 6.1* 5.3*  ALBUMIN 3.4* 2.7*   No results for input(s): LIPASE, AMYLASE in the last 168 hours. No results for input(s): AMMONIA in the last 168 hours. Coagulation Profile: Recent Labs  Lab 09/30/21 0857 10/01/21 0709  INR 1.1 1.2   Cardiac Enzymes: No results for input(s): CKTOTAL, CKMB, CKMBINDEX, TROPONINI in the last 168 hours. BNP (last 3 results) No results for input(s): PROBNP in the last 8760 hours. HbA1C: No results for input(s): HGBA1C in  the last 72 hours. CBG: Recent Labs  Lab 09/30/21 1817  GLUCAP 84   Lipid Profile: No results for input(s): CHOL, HDL, LDLCALC, TRIG, CHOLHDL, LDLDIRECT in the last 72 hours. Thyroid Function Tests: No results for input(s): TSH, T4TOTAL, FREET4, T3FREE, THYROIDAB in the last 72 hours. Anemia Panel: No results for input(s): VITAMINB12, FOLATE, FERRITIN, TIBC, IRON, RETICCTPCT in the last 72 hours. Sepsis Labs: No results for input(s): PROCALCITON, LATICACIDVEN in the last 168 hours.  Recent Results (from the past 240 hour(s))  Resp Panel by RT-PCR (Flu A&B, Covid) Nasopharyngeal Swab     Status: None   Collection Time: 09/30/21  8:57 AM   Specimen: Nasopharyngeal Swab; Nasopharyngeal(NP) swabs in vial transport medium  Result Value Ref Range Status   SARS Coronavirus 2 by RT PCR NEGATIVE NEGATIVE Final    Comment: (NOTE) SARS-CoV-2 target nucleic acids are NOT DETECTED.  The SARS-CoV-2 RNA is generally detectable in upper respiratory specimens during the acute phase of infection. The lowest concentration of SARS-CoV-2 viral copies this assay can detect is 138 copies/mL. A negative result does not preclude SARS-Cov-2 infection and should not be used as the sole basis for treatment or other patient  management decisions. A negative result may occur with  improper specimen collection/handling, submission of specimen other than nasopharyngeal swab, presence of viral mutation(s) within the areas targeted by this assay, and inadequate number of viral copies(<138 copies/mL). A negative result must be combined with clinical observations, patient history, and epidemiological information. The expected result is Negative.  Fact Sheet for Patients:  BloggerCourse.com  Fact Sheet for Healthcare Providers:  SeriousBroker.it  This test is no t yet approved or cleared by the [place] FDA and  has been authorized for detection and/or diagnosis of SARS-CoV-2 by FDA under an Emergency Use Authorization (EUA). This EUA will remain  in effect (meaning this test can be used) for the duration of the COVID-19 declaration under Section 564(b)(1) of the Act, 21 U.S.C.section 360bbb-3(b)(1), unless the authorization is terminated  or revoked sooner.       Influenza A by PCR NEGATIVE NEGATIVE Final   Influenza B by PCR NEGATIVE NEGATIVE Final    Comment: (NOTE) The Xpert Xpress SARS-CoV-2/FLU/RSV plus assay is intended as an aid in the diagnosis of influenza from Nasopharyngeal swab specimens and should not be used as a sole basis for treatment. Nasal washings and aspirates are unacceptable for Xpert Xpress SARS-CoV-2/FLU/RSV testing.  Fact Sheet for Patients: BloggerCourse.com  Fact Sheet for Healthcare Providers: SeriousBroker.it  This test is not yet approved or cleared by the [place] FDA and has been authorized for detection and/or diagnosis of SARS-CoV-2 by FDA under an Emergency Use Authorization (EUA). This EUA will remain in effect (meaning this test can be used) for the duration of the COVID-19 declaration under Section 564(b)(1) of the Act, 21 U.S.C. section 360bbb-3(b)(1),  unless the authorization is terminated or revoked.  Performed at [facility], [address] [place], [place] [address]   MRSA Next Gen by PCR, Nasal     Status: None   Collection Time: 09/30/21  6:32 PM   Specimen: Nasal Mucosa; Nasal Swab  Result Value Ref Range Status   MRSA by PCR Next Gen NOT DETECTED NOT DETECTED Final    Comment: (NOTE) The GeneXpert MRSA Assay (FDA approved for NASAL specimens only), is one component of a comprehensive MRSA colonization surveillance program. It is not intended to diagnose MRSA infection nor to guide or monitor treatment for  MRSA infections. Test performance is not FDA approved in patients less than 108 years old. Performed at [facility], [address] [place], [place] [address]       Imaging Studies   CT Angio Abd/Pel w/ and/or w/o  Result Date: 09/30/2021 CLINICAL DATA:  Concern for GI bleed. History of dark stools and ulcers. EXAM: CTA ABDOMEN AND PELVIS WITHOUT AND WITH CONTRAST TECHNIQUE: Multidetector CT imaging of the abdomen and pelvis was performed using the standard protocol during bolus administration of intravenous contrast. Multiplanar reconstructed images and MIPs were obtained and reviewed to evaluate the vascular anatomy. CONTRAST:  OMNIPAQUE IOHEXOL 350 MG/ML SOLN COMPARISON:  None. FINDINGS: VASCULAR Aorta: Normal caliber aorta without aneurysm, dissection, vasculitis or significant stenosis. Celiac: Patent without evidence of aneurysm, dissection, vasculitis or significant stenosis. SMA: Patent without evidence of aneurysm, dissection, vasculitis or significant stenosis. Renals: Both renal arteries are patent without evidence of aneurysm, dissection, vasculitis, fibromuscular dysplasia or significant stenosis. IMA: Patent without evidence of aneurysm, dissection, vasculitis or significant stenosis. Inflow: Patent without evidence of aneurysm, dissection, vasculitis or significant stenosis. Proximal  outflow: Patent without evidence of aneurysm, dissection, vasculitis or significant stenosis. Veins: The main portal and splenic veins are patent on the venous phase images. There is no evidence of active GI bleed. Review of the MIP images confirms the above findings. NON-VASCULAR Lower chest: There is mild atelectasis in the left lung base. There is a small calcified granuloma in the right lung base. The imaged heart is unremarkable. Hepatobiliary: The liver and gallbladder are unremarkable. There is no biliary ductal dilatation. Pancreas: Unremarkable. Spleen: There is a lobulated hypodense lesion in the spleen measuring up to approximately 1.3 cm which may reflect a hemangioma. Adrenals/Urinary Tract: The adrenals are unremarkable. There is a parapelvic cyst in the left kidney measuring approximately 1.4 cm. There are no other focal lesions. There are no stones. There is no hydronephrosis or hydroureter. The bladder is unremarkable. Stomach/Bowel: There is a moderate size hiatal hernia containing much of the gastric fundus. There is no evidence of obstruction. There is no evidence of small-bowel obstruction. There is no abnormal bowel wall thickening or inflammatory change. Lymphatic: There is no abdominal or pelvic lymphadenopathy. Reproductive: There is a small calcified uterine fibroid at the left aspect of the fundus. There is no adnexal mass. Other: There is no ascites or free air. Musculoskeletal: The patient is status post L4-L5 T left. There is disc space narrowing and vacuum disc phenomenon at L5-S1. The bones are diffusely demineralized. There is an intraosseous hemangioma in the L1 vertebral body. There is no acute osseous abnormality or aggressive osseous lesion. IMPRESSION: VASCULAR No evidence of active GI bleed. Unremarkable appearance of the vasculature of the abdomen and pelvis. NON-VASCULAR 1. No acute findings in the abdomen or pelvis. 2. Moderate size hiatal hernia containing much of the  gastric fundus. Electronically Signed   By: Lesia Hausen M.D.   On: 09/30/2021 11:02     Medications   Scheduled Meds:  propranolol ER  160 mg Oral Daily   rOPINIRole  2 mg Oral QHS   sodium chloride flush  3 mL Intravenous Q12H   Continuous Infusions:  sodium chloride Stopped (09/30/21 0938)   sodium chloride 50 mL/hr at 10/01/21 1510   pantoprazole 8 mg/hr (10/01/21 0755)       LOS: 1 day    Time spent: 30 minutes    Pennie Banter, DO [facility]  10/01/2021, 3:45 PM  If 7PM-7AM, please contact night-coverage. How to contact the [facility] Attending or Consulting provider [place] or covering provider during after hours [place], for this patient?    Check the care team in [facility] and look for a) attending/consulting [facility] provider listed and b) the [facility] team listed Log into [email or web address] and use [place]'s universal password to access. If you do not have the password, please contact the hospital operator. Locate the [facility] provider you are looking for under [facility] and page to a number that you can be directly reached. If you still have difficulty reaching the provider, please page the [facility] (Director on Call) for the Hospitalists listed on amion for assistance.

## 2021-10-01 NOTE — Anesthesia Procedure Notes (Signed)
Date/Time: 10/01/2021 1:00 PM Performed by: Stormy Fabian, CRNA Pre-anesthesia Checklist: Patient identified, Emergency Drugs available, Suction available and Patient being monitored Patient Re-evaluated:Patient Re-evaluated prior to induction Oxygen Delivery Method: Nasal cannula Induction Type: IV induction Dental Injury: Teeth and Oropharynx as per pre-operative assessment  Comments: Nasal cannula with etCO2 monitoring

## 2021-10-01 NOTE — Anesthesia Preprocedure Evaluation (Signed)
Anesthesia Evaluation  Patient identified by MRN, date of birth, ID band Patient awake    Reviewed: Allergy & Precautions, NPO status , Patient's Chart, lab work & pertinent test results  Airway Mallampati: III  TM Distance: <3 FB Neck ROM: full    Dental  (+) Chipped   Pulmonary neg pulmonary ROS,    Pulmonary exam normal        Cardiovascular hypertension, (-) angina(-) Past MI and (-) DOE Normal cardiovascular exam+ dysrhythmias Atrial Fibrillation  Rhythm:Regular     Neuro/Psych  Headaches, PSYCHIATRIC DISORDERS Anxiety    GI/Hepatic Neg liver ROS, Bowel prep,GERD  Medicated and Controlled,  Endo/Other  negative endocrine ROS  Renal/GU negative Renal ROS  negative genitourinary   Musculoskeletal  (+) Arthritis ,   Abdominal   Peds  Hematology negative hematology ROS (+)   Anesthesia Other Findings Past Medical History: No date: Anxiety No date: Arthritis No date: GERD (gastroesophageal reflux disease) No date: Hypertension     Comment:  Takes propranolol for HTN Does not see a cardiologist No date: Migraine headache No date: Restless leg syndrome No date: Vitamin D deficiency  Past Surgical History: No date: APPENDECTOMY     Comment:  done as part of tubal ligation No date: TUBAL LIGATION  BMI    Body Mass Index: 24.20 kg/m      Reproductive/Obstetrics negative OB ROS                           Anesthesia Physical  Anesthesia Plan  ASA: 3  Anesthesia Plan: General   Post-op Pain Management:    Induction: Intravenous  PONV Risk Score and Plan: Propofol infusion and TIVA  Airway Management Planned: Natural Airway and Nasal Cannula  Additional Equipment:   Intra-op Plan:   Post-operative Plan:   Informed Consent: I have reviewed the patients History and Physical, chart, labs and discussed the procedure including the risks, benefits and alternatives for the  proposed anesthesia with the patient or authorized representative who has indicated his/her understanding and acceptance.     Dental Advisory Given  Plan Discussed with: Anesthesiologist, CRNA and Surgeon  Anesthesia Plan Comments: (Patient consented for risks of anesthesia including but not limited to:  - adverse reactions to medications - risk of airway placement if required - damage to eyes, teeth, lips or other oral mucosa - nerve damage due to positioning  - sore throat or hoarseness - Damage to heart, brain, nerves, lungs, other parts of body or loss of life  Patient voiced understanding.)        Anesthesia Quick Evaluation

## 2021-10-01 NOTE — Transfer of Care (Signed)
Immediate Anesthesia Transfer of Care Note  Patient: Amanda Brooks  Procedure(s) Performed: COLONOSCOPY  Patient Location: PACU and Endoscopy Unit  Anesthesia Type:General  Level of Consciousness: drowsy  Airway & Oxygen Therapy: Patient Spontanous Breathing  Post-op Assessment: Report given to RN and Post -op Vital signs reviewed and stable  Post vital signs: Reviewed and stable  Last Vitals:  Vitals Value Taken Time  BP 97/44 10/01/21 1330  Temp    Pulse 77 10/01/21 1331  Resp 19 10/01/21 1331  SpO2 100 % 10/01/21 1331  Vitals shown include unvalidated device data.  Last Pain:  Vitals:   10/01/21 0900  TempSrc: Oral  PainSc:       Patients Stated Pain Goal: 0 (09/30/21 1518)  Complications: No notable events documented.

## 2021-10-01 NOTE — Anesthesia Postprocedure Evaluation (Signed)
Anesthesia Post Note  Patient: DIETRA STOKELY  Procedure(s) Performed: COLONOSCOPY  Patient location during evaluation: Phase II Anesthesia Type: General Level of consciousness: awake and alert, awake and oriented Pain management: pain level controlled Vital Signs Assessment: post-procedure vital signs reviewed and stable Respiratory status: spontaneous breathing, nonlabored ventilation and respiratory function stable Cardiovascular status: blood pressure returned to baseline and stable Postop Assessment: no apparent nausea or vomiting Anesthetic complications: no   No notable events documented.   Last Vitals:  Vitals:   10/01/21 1100 10/01/21 1330  BP: 116/60 (!) 97/44  Pulse:  76  Resp: 19 19  Temp:  (!) 36.1 C  SpO2:  100%    Last Pain:  Vitals:   10/01/21 1400  TempSrc:   PainSc: 0-No pain                 Manfred Arch

## 2021-10-01 NOTE — Interval H&P Note (Signed)
History and Physical Interval Note:  10/01/2021 12:19 PM  Amanda Brooks  has presented today for surgery, with the diagnosis of Melena, acute blood loss anemia, symptomatic anemia.  The various methods of treatment have been discussed with the patient and family. After consideration of risks, benefits and other options for treatment, the patient has consented to  Procedure(s): COLONOSCOPY (N/A) as a surgical intervention.  The patient's history has been reviewed, patient examined, no change in status, stable for surgery.  I have reviewed the patient's chart and labs.  Questions were answered to the patient's satisfaction.     [place], [place]

## 2021-10-01 NOTE — Progress Notes (Signed)
Report called to Care Nurse, room 160.

## 2021-10-01 NOTE — Op Note (Signed)
[facility] Gastroenterology Patient Name: Amanda Brooks Procedure Date: 10/01/2021 12:57 PM MRN: [id number] Account #: [id number] Date of Birth: Oct 28, 1951 Admit Type: Inpatient Age: 70 Room: [facility] ENDO ROOM 4 Gender: Female Note Status: Finalized Instrument Name: Prentice Docker [id number] Procedure:             Colonoscopy Indications:           Hematochezia, Acute post hemorrhagic anemia Providers:             Boykin Nearing. Norma Fredrickson MD, MD Referring MD:          No Local Md, MD (Referring MD) Medicines:             Propofol per Anesthesia Complications:         No immediate complications. Procedure:             Pre-Anesthesia Assessment:                        - The risks and benefits of the procedure and the                         sedation options and risks were discussed with the                         patient. All questions were answered and informed                         consent was obtained.                        - Patient identification and proposed procedure were                         verified prior to the procedure by the nurse. The                         procedure was verified in the procedure room.                        - ASA Grade Assessment: III - A patient with severe                         systemic disease.                        - After reviewing the risks and benefits, the patient                         was deemed in satisfactory condition to undergo the                         procedure.                        After obtaining informed consent, the colonoscope was                         passed under direct vision. Throughout the procedure,  the patient's blood pressure, pulse, and oxygen                         saturations were monitored continuously. The                         Colonoscope was introduced through the anus and                         advanced to the the cecum, identified by appendiceal                          orifice and ileocecal valve. The colonoscopy was                         technically difficult and complex due to unusual                         anatomy, a redundant colon and significant looping.                         Successful completion of the procedure was aided by                         changing the patient to a supine position. The patient                         tolerated the procedure well. The quality of the bowel                         preparation was excellent. The ileocecal valve,                         appendiceal orifice, and rectum were photographed. Findings:      The perianal and digital rectal examinations were normal. Pertinent       negatives include normal sphincter tone and no palpable rectal lesions.      Non-bleeding internal hemorrhoids were found during retroflexion. The       hemorrhoids were Grade I (internal hemorrhoids that do not prolapse).      Normal mucosa was found in the entire colon. Estimated blood loss: none. Impression:            - Non-bleeding internal hemorrhoids.                        - Normal mucosa in the entire examined colon.                        - No specimens collected. Recommendation:        - Return patient to hospital ward for possible                         discharge same day.                        - Advance diet as tolerated.                        - Continue present medications.                        -  Return to GI office in 2 months.                        - Repeat EGD in 2-3 months to establish ulcer healing. Procedure Code(s):     --- Professional ---                        [phone], Colonoscopy, flexible; diagnostic, including                         collection of specimen(s) by brushing or washing, when                         performed (separate procedure) Diagnosis Code(s):     --- Professional ---                        D62, Acute posthemorrhagic anemia                        K92.1, Melena (includes  Hematochezia)                        K64.0, First degree hemorrhoids CPT copyright 2019 American Medical Association. All rights reserved. The codes documented in this report are preliminary and upon coder review may  be revised to meet current compliance requirements. Stanton Kidney MD, MD 10/01/2021 1:36:07 PM This report has been signed electronically. Number of Addenda: 0 Note Initiated On: 10/01/2021 12:57 PM Scope Withdrawal Time: 0 hours 3 minutes 31 seconds  Total Procedure Duration: 0 hours 20 minutes 20 seconds  Estimated Blood Loss:  Estimated blood loss: none.      [facility]

## 2021-10-02 ENCOUNTER — Encounter: Payer: Self-pay | Admitting: Internal Medicine

## 2021-10-02 DIAGNOSIS — K922 Gastrointestinal hemorrhage, unspecified: Secondary | ICD-10-CM | POA: Diagnosis not present

## 2021-10-02 LAB — BASIC METABOLIC PANEL
Anion gap: 6 (ref 5–15)
BUN: 12 mg/dL (ref 8–23)
CO2: 22 mmol/L (ref 22–32)
Calcium: 7.9 mg/dL — ABNORMAL LOW (ref 8.9–10.3)
Chloride: 113 mmol/L — ABNORMAL HIGH (ref 98–111)
Creatinine, Ser: 0.43 mg/dL — ABNORMAL LOW (ref 0.44–1.00)
GFR, Estimated: >60 mL/min (ref >60)
Glucose, Bld: 112 mg/dL — ABNORMAL HIGH (ref 70–99)
Potassium: 3.3 mmol/L — ABNORMAL LOW (ref 3.5–5.1)
Sodium: 141 mmol/L (ref 135–145)

## 2021-10-02 LAB — HEMOGLOBIN AND HEMATOCRIT, BLOOD
HCT: 22.3 % — ABNORMAL LOW (ref 36.0–46.0)
HCT: 24.2 % — ABNORMAL LOW (ref 36.0–46.0)
HCT: 28.9 % — ABNORMAL LOW (ref 36.0–46.0)
Hemoglobin: 7.3 g/dL — ABNORMAL LOW (ref 12.0–15.0)
Hemoglobin: 7.9 g/dL — ABNORMAL LOW (ref 12.0–15.0)
Hemoglobin: 9.8 g/dL — ABNORMAL LOW (ref 12.0–15.0)

## 2021-10-02 LAB — PREPARE RBC (CROSSMATCH)

## 2021-10-02 LAB — MAGNESIUM: Magnesium: 2.1 mg/dL (ref 1.7–2.4)

## 2021-10-02 LAB — SURGICAL PATHOLOGY

## 2021-10-02 MED ORDER — MENTHOL 3 MG MT LOZG
1.0000 | LOZENGE | OROMUCOSAL | Status: DC | PRN
  Administered 2021-10-03: 3 mg via ORAL
  Filled 2021-10-02: qty 9

## 2021-10-02 MED ORDER — BUTALBITAL-APAP-CAFFEINE 50-325-40 MG PO TABS
1.0000 | ORAL_TABLET | Freq: Four times a day (QID) | ORAL | Status: DC | PRN
  Administered 2021-10-02 – 2021-10-03 (×2): 1 via ORAL
  Filled 2021-10-02 (×2): qty 1

## 2021-10-02 MED ORDER — POTASSIUM CHLORIDE CRYS ER 20 MEQ PO TBCR
40.0000 meq | EXTENDED_RELEASE_TABLET | ORAL | Status: AC
  Administered 2021-10-02 (×2): 40 meq via ORAL
  Filled 2021-10-02 (×2): qty 2

## 2021-10-02 MED ORDER — SODIUM CHLORIDE 0.9% IV SOLUTION: Freq: Once | INTRAVENOUS | Status: AC

## 2021-10-02 NOTE — Progress Notes (Signed)
PROGRESS NOTE    Amanda Brooks   SRP:[id number]  DOB: 19-Nov-1950  PCP: Pcp, No    DOA: 09/30/2021 LOS: 2    Brief Narrative / Hospital Course to Date:   70 y.o. female with PMH significant of anxiety disorder, arthritis, gastric reflux, essential hypertension, peptic ulcer disease presented in the ED with complaints of generalized weakness and dizziness, in addition to diarrhea followed by black tarry stools for x 2 days.  Generalized weakness progressed to the point patient was unable to get out of bed morning of admission and patient was noted to be pale and found hypotensive with systolic BP in the 80s.  Patient was hypotensive and tachycardic in the ED.  Hemoglobin was found to be 7.0, down from 12.0 three weeks ago.  CTA abdomen/pelvis was negative for evidence of active GI bleeding with unremarkable appearance of the vasculature of the abdomen pelvis.  Patient was admitted to [facility] service with GI consulted, transfused 2 units packed RBCs with improvement in hemoglobin to 10.1.  Started on Protonix drip.  Patient was taken for EGD on the afternoon of admission.  It showed normal esophagus, large hiatal hernia, erosive gastropathy with no bleeding or stigmata of recent bleeding, gastritis which was biopsied, nonbleeding gastric and duodenal ulcers also with no stigmata of recent bleeding.  Patient then underwent bowel prep and was taken for colonoscopy on 11/17.  It showed nonbleeding internal hemorrhoids, normal mucosa of the entire examined colon.  Assessment & Plan   Principal Problem:   Acute GI bleeding Active Problems:   Restless leg syndrome   Acute blood loss anemia due to GI bleeding -no definitive source of bleeding was found on EGD or colonoscopy.  Is possible her duodenal or gastric ulcers were bleeding previously, versus small bowel source such as AVM. Presented with hemoglobin 7.0, improved to 10.1 after 2 units PRBCs transfused. 11/17: Hemoglobin down to 8.3,  stable this afternoon at 8.2. 11/18: Hemoglobin this morning down to 7.3 but rebounded to 7.9 --Continue trending hemoglobin and transfuse if further decline or if less than 7 --Transfuse 1 more unit PRBCs today --GI recommends outpatient follow-up in 2 months --Diet resumed --Continue IV PPI and transition to oral twice daily at discharge   Hypokalemia -K3.3 this morning.  Replacing with 40 mEq oral KCl.  Likely due to bowel prep/diarrhea.  Replace further as needed.  Leukocytosis -most likely reactive in the setting of bleeding.   Patient is afebrile and without signs or symptoms of infection. --Monitor CBC --Monitor for signs symptoms of infection  Essential hypertension -continue home propranolol  Restless leg syndrome -continue home Requip  Vertigo - history of vertigo-like symptoms that were fairly intractable and lasted quite some time, still not driving.  She was supposed to see neurology at [facility] (scheduled back in July) the day she was admitted here.  Had apparently been seen by ENT who did not think it was vestibular in nature. Did not have much relief with meclizine.  Recently, gets occasional symptoms with position changes.  Still has not resumed driving. -- PT and OT evaluations --Discuss with neurology and attempt to set up a local appointment    Patient BMI: Body mass index is 25.73 kg/m.   DVT prophylaxis: SCDs Start: 09/30/21 1225   Diet:  Diet Orders (From admission, onward)     Start     Ordered   10/01/21 1501  Diet regular Room service appropriate? Yes; Fluid consistency: Thin  Diet effective now  Question Answer Comment  Room service appropriate? Yes   Fluid consistency: Thin      10/01/21 1500              Code Status: Full Code   Subjective 10/02/21    Patient awake resting in bed when seen today.  Husband and 2 daughters at bedside.  Patient reports overall feeling well.  No BMs yet since starting diet again yesterday after procedures.   Reports appetite is good.  Otherwise no acute complaints  She provides past history of vertigo-like symptoms that were fairly intractable and lasted quite some time, still not driving.  She was supposed to see neurology the day she was admitted here.  Had apparently been seen by ENT who did not think it was vestibular in nature.  Agreeable to be seen by PT.   Disposition Plan & Communication   Status is: Inpatient  Remains inpatient appropriate because: Warrants further close monitoring of hemoglobin until at least 24 hours stable and trending upward   Family Communication: Husband and 2 daughters at bedside on rounds today 11/18.   Consults, Procedures, Significant Events   Consultants:  Gastroenterology  Procedures:  EGD 11/16 Colonoscopy 11/17  Antimicrobials:  Anti-infectives (From admission, onward)    None         Micro    Objective   Vitals:   10/02/21 0951 10/02/21 1033 10/02/21 1116 10/02/21 1346  BP: 105/61 (!) 114/57 (!) 123/57 (!) 150/74  Pulse: 93 77 70 76  Resp:  17 14 16   Temp: 98.4 F (36.9 C) 98.2 F (36.8 C) 97.9 F (36.6 C) 98.4 F (36.9 C)  TempSrc: Oral Oral  Oral  SpO2: 99% 100% 100% 100%  Weight:      Height:        Intake/Output Summary (Last 24 hours) at 10/02/2021 1410 Last data filed at 10/02/2021 1410 Gross per 24 hour  Intake 673 ml  Output 300 ml  Net 373 ml   Filed Weights   09/30/21 0847 09/30/21 1800  Weight: 68 kg 72.3 kg    Physical Exam:  General exam: awake, alert, no acute distress Respiratory system: normal respiratory effort, on room air. Cardiovascular system: Regular rate and rhythm, no peripheral edema.   Gastrointestinal system: soft, non-distended Central nervous system: A&O x4. no gross focal neurologic deficits, normal speech Skin: Pale, dry, intact, normal temperature Psychiatry: normal mood, congruent affect, judgement and insight appear normal  Labs   Data Reviewed: I have personally  reviewed following labs and imaging studies  CBC: Recent Labs  Lab 09/30/21 0857 09/30/21 1421 10/01/21 0709 10/01/21 1502 10/01/21 2104 10/02/21 0052 10/02/21 0740  WBC 22.5*  --  10.6*  --   --   --   --   NEUTROABS 18.0*  --   --   --   --   --   --   HGB 7.0*   < > 8.3* 8.2* 7.7* 7.3* 7.9*  HCT 21.8*   < > 24.2* 24.0* 22.8* 22.3* 24.2*  MCV 95.2  --  91.0  --   --   --   --   PLT 222  --  159  --   --   --   --    < > = values in this interval not displayed.   Basic Metabolic Panel: Recent Labs  Lab 09/30/21 0857 10/01/21 0709 10/02/21 0052  NA 138 143 141  K 3.8 3.5 3.3*  CL 111 113* 113*  CO2  18* 22 22  GLUCOSE 171* 117* 112*  BUN 35* 15 12  CREATININE 0.85 0.58 0.43*  CALCIUM 8.3* 7.7* 7.9*  MG  --  2.1 2.1  PHOS  --  2.8  --    GFR: Estimated Creatinine Clearance: 67.6 mL/min (A) (by C-G formula based on SCr of 0.43 mg/dL (L)). Liver Function Tests: Recent Labs  Lab 09/30/21 0857 10/01/21 0709  AST 26 25  ALT 17 17  ALKPHOS 52 47  BILITOT 0.4 0.5  PROT 6.1* 5.3*  ALBUMIN 3.4* 2.7*   No results for input(s): LIPASE, AMYLASE in the last 168 hours. No results for input(s): AMMONIA in the last 168 hours. Coagulation Profile: Recent Labs  Lab 09/30/21 0857 10/01/21 0709  INR 1.1 1.2   Cardiac Enzymes: No results for input(s): CKTOTAL, CKMB, CKMBINDEX, TROPONINI in the last 168 hours. BNP (last 3 results) No results for input(s): PROBNP in the last 8760 hours. HbA1C: No results for input(s): HGBA1C in the last 72 hours. CBG: Recent Labs  Lab 09/30/21 1817  GLUCAP 84   Lipid Profile: No results for input(s): CHOL, HDL, LDLCALC, TRIG, CHOLHDL, LDLDIRECT in the last 72 hours. Thyroid Function Tests: No results for input(s): TSH, T4TOTAL, FREET4, T3FREE, THYROIDAB in the last 72 hours. Anemia Panel: No results for input(s): VITAMINB12, FOLATE, FERRITIN, TIBC, IRON, RETICCTPCT in the last 72 hours. Sepsis Labs: No results for input(s):  PROCALCITON, LATICACIDVEN in the last 168 hours.  Recent Results (from the past 240 hour(s))  Resp Panel by RT-PCR (Flu A&B, Covid) Nasopharyngeal Swab     Status: None   Collection Time: 09/30/21  8:57 AM   Specimen: Nasopharyngeal Swab; Nasopharyngeal(NP) swabs in vial transport medium  Result Value Ref Range Status   SARS Coronavirus 2 by RT PCR NEGATIVE NEGATIVE Final    Comment: (NOTE) SARS-CoV-2 target nucleic acids are NOT DETECTED.  The SARS-CoV-2 RNA is generally detectable in upper respiratory specimens during the acute phase of infection. The lowest concentration of SARS-CoV-2 viral copies this assay can detect is 138 copies/mL. A negative result does not preclude SARS-Cov-2 infection and should not be used as the sole basis for treatment or other patient management decisions. A negative result may occur with  improper specimen collection/handling, submission of specimen other than nasopharyngeal swab, presence of viral mutation(s) within the areas targeted by this assay, and inadequate number of viral copies(<138 copies/mL). A negative result must be combined with clinical observations, patient history, and epidemiological information. The expected result is Negative.  Fact Sheet for Patients:  BloggerCourse.com  Fact Sheet for Healthcare Providers:  SeriousBroker.it  This test is no t yet approved or cleared by the [place] FDA and  has been authorized for detection and/or diagnosis of SARS-CoV-2 by FDA under an Emergency Use Authorization (EUA). This EUA will remain  in effect (meaning this test can be used) for the duration of the COVID-19 declaration under Section 564(b)(1) of the Act, 21 U.S.C.section 360bbb-3(b)(1), unless the authorization is terminated  or revoked sooner.       Influenza A by PCR NEGATIVE NEGATIVE Final   Influenza B by PCR NEGATIVE NEGATIVE Final    Comment: (NOTE) The Xpert Xpress  SARS-CoV-2/FLU/RSV plus assay is intended as an aid in the diagnosis of influenza from Nasopharyngeal swab specimens and should not be used as a sole basis for treatment. Nasal washings and aspirates are unacceptable for Xpert Xpress SARS-CoV-2/FLU/RSV testing.  Fact Sheet for Patients: BloggerCourse.com  Fact Sheet for Healthcare Providers: SeriousBroker.it  This test is not yet approved or cleared by the [place] and has been authorized for detection and/or diagnosis of SARS-CoV-2 by FDA under an Emergency Use Authorization (EUA). This EUA will remain in effect (meaning this test can be used) for the duration of the COVID-19 declaration under Section 564(b)(1) of the Act, 21 U.S.C. section 360bbb-3(b)(1), unless the authorization is terminated or revoked.  Performed at [facility], [address] [place], [place] [address]   MRSA Next Gen by PCR, Nasal     Status: None   Collection Time: 09/30/21  6:32 PM   Specimen: Nasal Mucosa; Nasal Swab  Result Value Ref Range Status   MRSA by PCR Next Gen NOT DETECTED NOT DETECTED Final    Comment: (NOTE) The GeneXpert MRSA Assay (FDA approved for NASAL specimens only), is one component of a comprehensive MRSA colonization surveillance program. It is not intended to diagnose MRSA infection nor to guide or monitor treatment for MRSA infections. Test performance is not FDA approved in patients less than 36 years old. Performed at [facility], [address] [place], [place] [address]       Imaging Studies   No results found.   Medications   Scheduled Meds:  pantoprazole  40 mg Intravenous Q12H   propranolol ER  160 mg Oral Daily   rOPINIRole  2 mg Oral QHS   sodium chloride flush  3 mL Intravenous Q12H   Continuous Infusions:       LOS: 2 days    Time spent: 25 minutes with > 50% spent at bedside and in coordination of care      Pennie Banter, DO [facility]  10/02/2021, 2:10 PM      If 7PM-7AM, please contact night-coverage. How to contact the [facility] Attending or Consulting provider 7A - 7P or covering provider during after hours 7P -7A, for this patient?    Check the care team in [facility] and look for a) attending/consulting [facility] provider listed and b) the [facility] team listed Log into [email or web address] and use [facility] universal password to access. If you do not have the password, please contact the hospital operator. Locate the [facility] provider you are looking for under [facility] and page to a number that you can be directly reached. If you still have difficulty reaching the provider, please page the [facility] (Director on Call) for the Hospitalists listed on amion for assistance.

## 2021-10-02 NOTE — Evaluation (Signed)
Physical Therapy Evaluation Patient Details Name: Amanda Brooks MRN: [id number] DOB: [birth year] Today's Date: 10/02/2021  History of Present Illness  Patient is a 70 year old female with PMH significant of anxiety disorder, arthritis, gastric reflux, essential hypertension, peptic ulcer disease presented to [facility] with a chief complaint of generalized weakness and dizziness in addition to diarrhea followed by black tarry stools for x 2 days.   Clinical Impression  Patient tolerated session well and was agreeable to treatment. Upon arrival patient was sitting EOB talking with family. Per RN, patient has been ambulating to/from restroom with supervision from family members. Patient reported no pain throughout session. Patient is Independent with all bed mobility and sit to stand transfers. BLE strength globally 4+/5 strength. Patient was able to ambulate around the nurses station one and a half times, ascend and descend the stairs twice, following back walking back to her room without any rest break. She completed this at supervision, and with no AD. All education given to patient, signing off on further skilled physical therapy. Discharge in house.      Recommendations for follow up therapy are one component of a multi-disciplinary discharge planning process, led by the attending physician.  Recommendations may be updated based on patient status, additional functional criteria and insurance authorization.  Follow Up Recommendations No PT follow up    Assistance Recommended at Discharge PRN  Functional Status Assessment Patient has had a recent decline in their functional status and demonstrates the ability to make significant improvements in function in a reasonable and predictable amount of time.  Equipment Recommendations  None recommended by PT    Recommendations for Other Services       Precautions / Restrictions Precautions Precautions: Fall Restrictions Weight Bearing Restrictions: No       Mobility  Bed Mobility Overal bed mobility: Independent                  Transfers Overall transfer level: Independent Equipment used: None                    Ambulation/Gait Ambulation/Gait assistance: Supervision (Patient reports occasional dizziness when walking) Gait Distance (Feet):  (x420, x130) Assistive device: None Gait Pattern/deviations: Step-through pattern;Decreased step length - right;Decreased step length - left Gait velocity: decreased cadence     General Gait Details: guarded posture when walking, downward gaze  Stairs Stairs: Yes Stairs assistance: Supervision Stair Management: One rail Right Number of Stairs: 8    Wheelchair Mobility    Modified Rankin (Stroke Patients Only)       Balance Overall balance assessment: Needs assistance Sitting-balance support: No upper extremity supported;Feet supported Sitting balance-Leahy Scale: Good     Standing balance support: During functional activity Standing balance-Leahy Scale: Fair                               Pertinent Vitals/Pain Pain Assessment: No/denies pain    Home Living Family/patient expects to be discharged to:: Private residence Living Arrangements: Spouse/significant other Available Help at Discharge: Family;Available 24 hours/day (Husband is available 24/7, children live down the street and are available PRN) Type of Home: House Home Access: Stairs to enter Entrance Stairs-Rails: Right Entrance Stairs-Number of Steps: 5   Home Layout: One level Home Equipment: Agricultural consultant (2 wheels)      Prior Function Prior Level of Function : Independent/Modified Independent  Hand Dominance   Dominant Hand: Right    Extremity/Trunk Assessment   Upper Extremity Assessment Upper Extremity Assessment: Overall WFL for tasks assessed    Lower Extremity Assessment Lower Extremity Assessment: Overall WFL for tasks assessed        Communication   Communication: No difficulties  Cognition Arousal/Alertness: Awake/alert Behavior During Therapy: WFL for tasks assessed/performed Overall Cognitive Status: Within Functional Limits for tasks assessed                                          General Comments      Exercises     Assessment/Plan    PT Assessment Patient does not need any further PT services  PT Problem List         PT Treatment Interventions      PT Goals (Current goals can be found in the Care Plan section)  Acute Rehab PT Goals Patient Stated Goal: Wants to go home PT Goal Formulation: With patient/family Time For Goal Achievement: 10/16/21 Potential to Achieve Goals: Good    Frequency     Barriers to discharge        Co-evaluation               AM-PAC PT "6 Clicks" Mobility  Outcome Measure Help needed turning from your back to your side while in a flat bed without using bedrails?: None Help needed moving from lying on your back to sitting on the side of a flat bed without using bedrails?: None Help needed moving to and from a bed to a chair (including a wheelchair)?: None Help needed standing up from a chair using your arms (e.g., wheelchair or bedside chair)?: A Little Help needed to walk in hospital room?: A Little Help needed climbing 3-5 steps with a railing? : None 6 Click Score: 22    End of Session Equipment Utilized During Treatment: Gait belt Activity Tolerance: Patient tolerated treatment well;No increased pain Patient left: in bed;with family/visitor present;with call bell/phone within reach Nurse Communication: Mobility status PT Visit Diagnosis: Other abnormalities of gait and mobility (R26.89)    Time: 9628-3662 PT Time Calculation (min) (ACUTE ONLY): 24 min   Charges:   PT Evaluation $PT Eval Low Complexity: 1 Low PT Treatments $Therapeutic Activity: 8-22 mins       Angelica Ran, PT  10/02/21. 3:42 PM

## 2021-10-02 NOTE — Care Management Important Message (Signed)
Important Message  Patient Details  Name: Amanda Brooks MRN: [id number] Date of Birth: Apr 23, 1951   Medicare Important Message Given:  Yes     [name] 10/02/2021, 1:30 PM

## 2021-10-02 NOTE — TOC Progression Note (Signed)
Transition of Care [facility]) - Progression Note    Patient Details  Name: Amanda Brooks MRN: [id number] Date of Birth: [birth year]  Transition of Care [facility]) CM/SW Cherry, RN Phone Number: 10/02/2021, 3:41 PM  Clinical Narrative:    Met with the patient and her husband and daughter in the room to discuss DC plan and needs She lives at home with her husband and the daughter is close by, she is independent at home and has no HH needs, she does not need DME at this time, she has transportation and can afford her medication        Expected Discharge Plan and Services                                                 Social Determinants of Health (SDOH) Interventions    Readmission Risk Interventions No flowsheet data found.

## 2021-10-03 LAB — CBC
HCT: 29.6 % — ABNORMAL LOW (ref 36.0–46.0)
Hemoglobin: 9.8 g/dL — ABNORMAL LOW (ref 12.0–15.0)
MCH: 30.7 pg (ref 26.0–34.0)
MCHC: 33.1 g/dL (ref 30.0–36.0)
MCV: 92.8 fL (ref 80.0–100.0)
Platelets: 190 10*3/uL (ref 150–400)
RBC: 3.19 MIL/uL — ABNORMAL LOW (ref 3.87–5.11)
RDW: 16.3 % — ABNORMAL HIGH (ref 11.5–15.5)
WBC: 10.1 10*3/uL (ref 4.0–10.5)
nRBC: 0 % (ref 0.0–0.2)

## 2021-10-03 LAB — TYPE AND SCREEN
ABO/RH(D): O POS
Antibody Screen: NEGATIVE
Unit division: 0
Unit division: 0
Unit division: 0

## 2021-10-03 LAB — BPAM RBC
Blood Product Expiration Date: 202211272359
Blood Product Expiration Date: 202212082359
Blood Product Expiration Date: 202212082359
ISSUE DATE / TIME: 202211160858
ISSUE DATE / TIME: 202211160858
ISSUE DATE / TIME: 202211181000
Unit Type and Rh: 5100
Unit Type and Rh: 5100
Unit Type and Rh: 5100

## 2021-10-03 LAB — BASIC METABOLIC PANEL
Anion gap: 4 — ABNORMAL LOW (ref 5–15)
BUN: 11 mg/dL (ref 8–23)
CO2: 27 mmol/L (ref 22–32)
Calcium: 8.5 mg/dL — ABNORMAL LOW (ref 8.9–10.3)
Chloride: 110 mmol/L (ref 98–111)
Creatinine, Ser: 0.64 mg/dL (ref 0.44–1.00)
GFR, Estimated: >60 mL/min (ref >60)
Glucose, Bld: 119 mg/dL — ABNORMAL HIGH (ref 70–99)
Potassium: 4.2 mmol/L (ref 3.5–5.1)
Sodium: 141 mmol/L (ref 135–145)

## 2021-10-03 NOTE — Plan of Care (Signed)

## 2021-10-03 NOTE — Progress Notes (Signed)
OT Cancellation Note  Patient Details Name: Amanda Brooks MRN: [id number] DOB: 1951-02-12   Cancelled Treatment:    OT screened, no needs identified, will sign off Pt up and to bathroom independently- denies falls the last 2 months. Tub shower and walk in shower - with rails and seat Husband and 2 daughters close by. No OT needs at this time   Lakyn Mantione, MaureenOTR/L,CLT 10/03/2021, 9:55 AM

## 2021-10-28 NOTE — Discharge Summary (Signed)
Physician Discharge Summary  Amanda Brooks GBT:[id number] DOB: 1951/08/26 DOA: 09/30/2021  PCP: Oneita Hurt, No  Admit date: 09/30/2021 Discharge date: 10/28/2021  Admitted From: home Disposition:  home  Recommendations for Outpatient Follow-up:  Follow up with PCP in 1-2 weeks Please obtain BMP/CBC in one week Please follow up with GI in 2 months Follow up on patient's vertigo symptoms  Home Health: None, no PT/OT needs Equipment/Devices: none   Discharge Condition: stable  CODE STATUS: Full  Diet recommendation: Regular      Discharge Diagnoses: Principal Problem:   Acute GI bleeding Active Problems:   Restless leg syndrome    Summary of HPI and Hospital Course:   Brief Narrative / Hospital Course to Date:   70 y.o. female with PMH significant of anxiety disorder, arthritis, gastric reflux, essential hypertension, peptic ulcer disease presented in the ED with complaints of generalized weakness and dizziness, in addition to diarrhea followed by black tarry stools for x 2 days.  Generalized weakness progressed to the point patient was unable to get out of bed morning of admission and patient was noted to be pale and found hypotensive with systolic BP in the 80s.   Patient was hypotensive and tachycardic in the ED.  Hemoglobin was found to be 7.0, down from 12.0 three weeks ago.  CTA abdomen/pelvis was negative for evidence of active GI bleeding with unremarkable appearance of the vasculature of the abdomen pelvis.   Patient was admitted to [facility] service with GI consulted, transfused 2 units packed RBCs with improvement in hemoglobin to 10.1.  Started on Protonix drip.   Patient was taken for EGD on the afternoon of admission.  It showed normal esophagus, large hiatal hernia, erosive gastropathy with no bleeding or stigmata of recent bleeding, gastritis which was biopsied, nonbleeding gastric and duodenal ulcers also with no stigmata of recent bleeding.   Patient then underwent  bowel prep and was taken for colonoscopy on 11/17.  It showed nonbleeding internal hemorrhoids, normal mucosa of the entire examined colon.  Assessment & Plan   Principal Problem:   Acute GI bleeding Active Problems:   Restless leg syndrome   Acute blood loss anemia due to GI bleeding -no definitive source of bleeding was found on EGD or colonoscopy.  Is possible her duodenal or gastric ulcers were bleeding previously, versus small bowel source such as AVM. Presented with hemoglobin 7.0, improved to 10.1 after 2 units PRBCs transfused. 11/17: Hemoglobin down to 8.3, stable this afternoon at 8.2. 11/18: Hemoglobin down to 7.3 but rebounded to 7.9, 3rd unit pRBC given --GI recommends outpatient follow-up in 2 months --Diet resumed and tolerating well --Treated with IV PPI >> oral twice daily at discharge    Hypokalemia - Replaced with oral KCl.  Likely due to bowel prep/diarrhea.  Monitor in follow up.   Leukocytosis -most likely reactive in the setting of bleeding.   Patient is afebrile and without signs or symptoms of infection. --Monitor CBC --Monitor for signs symptoms of infection  Essential hypertension -continue propranolol  Restless leg syndrome -continue Requip   Vertigo - history of vertigo-like symptoms that were fairly intractable and lasted quite some time, still not driving.  She was supposed to see neurology at [facility] (scheduled back in July) the day she was admitted here.  Had apparently been seen by ENT who did not think it was vestibular in nature. Did not have much relief with meclizine.  Recently, gets occasional symptoms with position changes.  Still has not resumed driving. --  PT/OT eval: no follow up needed --Discuss with neurology and attempt to set up a local appointment       Discharge Instructions   Discharge Instructions     Call MD for:  extreme fatigue   Complete by: As directed    Call MD for:  persistant dizziness or light-headedness   Complete  by: As directed    Call MD for:  persistant nausea and vomiting   Complete by: As directed    Call MD for:  severe uncontrolled pain   Complete by: As directed    Call MD for:  temperature >100.4   Complete by: As directed    Diet - low sodium heart healthy   Complete by: As directed    Discharge instructions   Complete by: As directed    Please have your Primary Care provider re-check labs in 1-2 weeks.  If you start having dark, maroon or red blood in your stools again, contact the GI clinic to let them know. You would also need to have labs checked quickly - either outpatient or come to the ER (especially if feeling more weak, tired and short of breath again - like you were before coming to the hospital this time).  Continue with Protonix - to reduce stomach acid.    GI wants to follow up with you in TWO months, but if you have questions or concerns, or have recurrent bleeding - call them to try to be seen sooner.     Also call the [facility] neurology office to request an appointment with any of the neurologists to evaluate your vertigo.  Their contact info is included in this packet.     --Dr. Nicole Kindred, DO   [facility]   [facility]   Increase activity slowly   Complete by: As directed       Allergies as of 10/03/2021       Reactions   Percocet [oxycodone-acetaminophen] Nausea Only        Medication List     STOP taking these medications    citalopram 40 MG tablet Commonly known as: CELEXA   cyclobenzaprine 10 MG tablet Commonly known as: Flexeril   HYDROcodone-acetaminophen 5-325 MG tablet Commonly known as: NORCO/VICODIN   meclizine 25 MG tablet Commonly known as: ANTIVERT   meloxicam 15 MG tablet Commonly known as: MOBIC   metoCLOPramide 10 MG tablet Commonly known as: REGLAN   omeprazole 20 MG capsule Commonly known as: PRILOSEC   SUMAtriptan 100 MG tablet Commonly known as: IMITREX   telmisartan 20 MG tablet Commonly known  as: MICARDIS   Vitamin D3 50 MCG (2000 UT) Tabs       TAKE these medications    pantoprazole 40 MG tablet Commonly known as: PROTONIX Take 40 mg by mouth daily.   propranolol ER 160 MG SR capsule Commonly known as: INDERAL LA Take 160 mg by mouth daily.   rOPINIRole 2 MG tablet Commonly known as: REQUIP Take 1 tablet (2 mg total) by mouth at bedtime.   traZODone 50 MG tablet Commonly known as: DESYREL Take 75 mg by mouth at bedtime.        Follow-up Information     Anabel Bene, MD. Schedule an appointment as soon as possible for a visit.   Specialty: Neurology Why: Dr. Melrose Nakayama or any provider, soonest available please for evaluation of possible central vertigo Contact information: [facility] [place] [address] [phone]  Wendell, Benay Pike, MD. Schedule an appointment as soon as possible for a visit in 2 month(s).   Specialty: Gastroenterology Why: Call office if having recurent dark, maroon or bloody stools. Contact information: [place] [address] [phone]                Allergies  Allergen Reactions   Percocet [Oxycodone-Acetaminophen] Nausea Only     If you experience worsening of your admission symptoms, develop shortness of breath, life threatening emergency, suicidal or homicidal thoughts you must seek medical attention immediately by calling 911 or calling your MD immediately  if symptoms less severe.    Please note   You were cared for by a hospitalist during your hospital stay. If you have any questions about your discharge medications or the care you received while you were in the hospital after you are discharged, you can call the unit and asked to speak with the hospitalist on call if the hospitalist that took care of you is not available. Once you are discharged, your primary care physician will handle any further medical issues. Please note that NO  REFILLS for any discharge medications will be authorized once you are discharged, as it is imperative that you return to your primary care physician (or establish a relationship with a primary care physician if you do not have one) for your aftercare needs so that they can reassess your need for medications and monitor your lab values.   Consultations: Gastroenterology    Procedures/Studies: CT Angio Abd/Pel w/ and/or w/o  Result Date: 09/30/2021 CLINICAL DATA:  Concern for GI bleed. History of dark stools and ulcers. EXAM: CTA ABDOMEN AND PELVIS WITHOUT AND WITH CONTRAST TECHNIQUE: Multidetector CT imaging of the abdomen and pelvis was performed using the standard protocol during bolus administration of intravenous contrast. Multiplanar reconstructed images and MIPs were obtained and reviewed to evaluate the vascular anatomy. CONTRAST:  112mL OMNIPAQUE IOHEXOL 350 MG/ML SOLN COMPARISON:  None. FINDINGS: VASCULAR Aorta: Normal caliber aorta without aneurysm, dissection, vasculitis or significant stenosis. Celiac: Patent without evidence of aneurysm, dissection, vasculitis or significant stenosis. SMA: Patent without evidence of aneurysm, dissection, vasculitis or significant stenosis. Renals: Both renal arteries are patent without evidence of aneurysm, dissection, vasculitis, fibromuscular dysplasia or significant stenosis. IMA: Patent without evidence of aneurysm, dissection, vasculitis or significant stenosis. Inflow: Patent without evidence of aneurysm, dissection, vasculitis or significant stenosis. Proximal outflow: Patent without evidence of aneurysm, dissection, vasculitis or significant stenosis. Veins: The main portal and splenic veins are patent on the venous phase images. There is no evidence of active GI bleed. Review of the MIP images confirms the above findings. NON-VASCULAR Lower chest: There is mild atelectasis in the left lung base. There is a small calcified granuloma in the right lung  base. The imaged heart is unremarkable. Hepatobiliary: The liver and gallbladder are unremarkable. There is no biliary ductal dilatation. Pancreas: Unremarkable. Spleen: There is a lobulated hypodense lesion in the spleen measuring up to approximately 1.3 cm which may reflect a hemangioma. Adrenals/Urinary Tract: The adrenals are unremarkable. There is a parapelvic cyst in the left kidney measuring approximately 1.4 cm. There are no other focal lesions. There are no stones. There is no hydronephrosis or hydroureter. The bladder is unremarkable. Stomach/Bowel: There is a moderate size hiatal hernia containing much of the gastric fundus. There is no evidence of obstruction. There is no evidence of small-bowel obstruction. There is no abnormal bowel wall thickening or inflammatory change. Lymphatic: There is no  abdominal or pelvic lymphadenopathy. Reproductive: There is a small calcified uterine fibroid at the left aspect of the fundus. There is no adnexal mass. Other: There is no ascites or free air. Musculoskeletal: The patient is status post L4-L5 T left. There is disc space narrowing and vacuum disc phenomenon at L5-S1. The bones are diffusely demineralized. There is an intraosseous hemangioma in the L1 vertebral body. There is no acute osseous abnormality or aggressive osseous lesion. IMPRESSION: VASCULAR No evidence of active GI bleed. Unremarkable appearance of the vasculature of the abdomen and pelvis. NON-VASCULAR 1. No acute findings in the abdomen or pelvis. 2. Moderate size hiatal hernia containing much of the gastric fundus. Electronically Signed   By: Valetta Mole M.D.   On: 09/30/2021 11:02        Subjective: Pt feeling better.  No bloody, maroon or tarry appearing stool.  Tolerating diet, no N/V.   Discharge Exam: Vitals:   10/03/21 0618 10/03/21 0810  BP: 137/70 137/90  Pulse: 76 84  Resp: 17 14  Temp: 98.4 F (36.9 C) 98.7 F (37.1 C)  SpO2: 95% 97%   Vitals:   10/02/21 1546  10/02/21 1950 10/03/21 0618 10/03/21 0810  BP: 134/69 137/75 137/70 137/90  Pulse: 79 80 76 84  Resp: 16 17 17 14   Temp: 98 F (36.7 C) 98.6 F (37 C) 98.4 F (36.9 C) 98.7 F (37.1 C)  TempSrc:      SpO2: 98% 98% 95% 97%  Weight:      Height:        General: Pt is alert, awake, not in acute distress Cardiovascular: RRR, S1/S2 +, no rubs, no gallops Respiratory: CTA bilaterally, no wheezing, no rhonchi Abdominal: Soft, NT, ND, bowel sounds + Extremities: no edema, no cyanosis    The results of significant diagnostics from this hospitalization (including imaging, microbiology, ancillary and laboratory) are listed below for reference.     Microbiology: No results found for this or any previous visit (from the past 240 hour(s)).   Labs: BNP (last 3 results) No results for input(s): BNP in the last 8760 hours. Basic Metabolic Panel: No results for input(s): NA, K, CL, CO2, GLUCOSE, BUN, CREATININE, CALCIUM, MG, PHOS in the last 168 hours. Liver Function Tests: No results for input(s): AST, ALT, ALKPHOS, BILITOT, PROT, ALBUMIN in the last 168 hours. No results for input(s): LIPASE, AMYLASE in the last 168 hours. No results for input(s): AMMONIA in the last 168 hours. CBC: No results for input(s): WBC, NEUTROABS, HGB, HCT, MCV, PLT in the last 168 hours. Cardiac Enzymes: No results for input(s): CKTOTAL, CKMB, CKMBINDEX, TROPONINI in the last 168 hours. BNP: Invalid input(s): POCBNP CBG: No results for input(s): GLUCAP in the last 168 hours. D-Dimer No results for input(s): DDIMER in the last 72 hours. Hgb A1c No results for input(s): HGBA1C in the last 72 hours. Lipid Profile No results for input(s): CHOL, HDL, LDLCALC, TRIG, CHOLHDL, LDLDIRECT in the last 72 hours. Thyroid function studies No results for input(s): TSH, T4TOTAL, T3FREE, THYROIDAB in the last 72 hours.  Invalid input(s): FREET3 Anemia work up No results for input(s): VITAMINB12, FOLATE, FERRITIN,  TIBC, IRON, RETICCTPCT in the last 72 hours. Urinalysis No results found for: COLORURINE, APPEARANCEUR, LABSPEC, [place], GLUCOSEU, HGBUR, BILIRUBINUR, KETONESUR, PROTEINUR, UROBILINOGEN, NITRITE, LEUKOCYTESUR Sepsis Labs Invalid input(s): PROCALCITONIN,  WBC,  LACTICIDVEN Microbiology No results found for this or any previous visit (from the past 240 hour(s)).   Time coordinating discharge: Over 30 minutes  SIGNED:  Ezekiel Slocumb, DO [facility] 10/28/2021, 8:35 PM   If 7PM-7AM, please contact night-coverage [email or web address]

## 2022-01-05 ENCOUNTER — Encounter: Payer: Self-pay | Admitting: Internal Medicine

## 2022-01-06 ENCOUNTER — Ambulatory Visit: Payer: Medicare Other | Admitting: Anesthesiology

## 2022-01-06 ENCOUNTER — Encounter: Payer: Self-pay | Admitting: Internal Medicine

## 2022-01-06 ENCOUNTER — Other Ambulatory Visit: Payer: Self-pay

## 2022-01-06 ENCOUNTER — Encounter
Admission: RE | Disposition: A | Discharge status: Home / Self Care | Payer: Self-pay | Source: Home / Self Care | Attending: Internal Medicine

## 2022-01-06 ENCOUNTER — Ambulatory Visit
Admission: RE | Admit: 2022-01-06 | Discharge: 2022-01-06 | Disposition: A | Discharge status: Home / Self Care | Payer: Medicare Other | Attending: Internal Medicine | Admitting: Internal Medicine

## 2022-01-06 DIAGNOSIS — K219 Gastro-esophageal reflux disease without esophagitis: Secondary | ICD-10-CM | POA: Insufficient documentation

## 2022-01-06 DIAGNOSIS — I10 Essential (primary) hypertension: Secondary | ICD-10-CM | POA: Insufficient documentation

## 2022-01-06 DIAGNOSIS — F419 Anxiety disorder, unspecified: Secondary | ICD-10-CM | POA: Insufficient documentation

## 2022-01-06 DIAGNOSIS — Z79899 Other long term (current) drug therapy: Secondary | ICD-10-CM | POA: Diagnosis not present

## 2022-01-06 DIAGNOSIS — E559 Vitamin D deficiency, unspecified: Secondary | ICD-10-CM | POA: Insufficient documentation

## 2022-01-06 DIAGNOSIS — Z09 Encounter for follow-up examination after completed treatment for conditions other than malignant neoplasm: Secondary | ICD-10-CM | POA: Diagnosis present

## 2022-01-06 DIAGNOSIS — K2970 Gastritis, unspecified, without bleeding: Secondary | ICD-10-CM | POA: Insufficient documentation

## 2022-01-06 DIAGNOSIS — Z9049 Acquired absence of other specified parts of digestive tract: Secondary | ICD-10-CM | POA: Insufficient documentation

## 2022-01-06 DIAGNOSIS — Z8711 Personal history of peptic ulcer disease: Secondary | ICD-10-CM | POA: Diagnosis not present

## 2022-01-06 DIAGNOSIS — D509 Iron deficiency anemia, unspecified: Secondary | ICD-10-CM | POA: Insufficient documentation

## 2022-01-06 DIAGNOSIS — K3189 Other diseases of stomach and duodenum: Secondary | ICD-10-CM | POA: Insufficient documentation

## 2022-01-06 DIAGNOSIS — G2581 Restless legs syndrome: Secondary | ICD-10-CM | POA: Insufficient documentation

## 2022-01-06 DIAGNOSIS — G43909 Migraine, unspecified, not intractable, without status migrainosus: Secondary | ICD-10-CM | POA: Insufficient documentation

## 2022-01-06 DIAGNOSIS — K449 Diaphragmatic hernia without obstruction or gangrene: Secondary | ICD-10-CM | POA: Insufficient documentation

## 2022-01-06 HISTORY — DX: Anemia, unspecified: D64.9

## 2022-01-06 HISTORY — PX: ESOPHAGOGASTRODUODENOSCOPY: SHX5428

## 2022-01-06 SURGERY — EGD (ESOPHAGOGASTRODUODENOSCOPY)
Anesthesia: General

## 2022-01-06 MED ORDER — LIDOCAINE HCL (CARDIAC) PF 100 MG/5ML IV SOSY
PREFILLED_SYRINGE | INTRAVENOUS | Status: DC | PRN
Start: 2022-01-06 — End: 2022-01-06
  Administered 2022-01-06: 100 mg via INTRAVENOUS

## 2022-01-06 MED ORDER — GLYCOPYRROLATE 0.2 MG/ML IJ SOLN
INTRAMUSCULAR | Status: DC | PRN
Start: 2022-01-06 — End: 2022-01-06
  Administered 2022-01-06: .2 mg via INTRAVENOUS

## 2022-01-06 MED ORDER — SODIUM CHLORIDE 0.9 % IV SOLN: INTRAVENOUS | Status: DC

## 2022-01-06 MED ORDER — PROPOFOL 500 MG/50ML IV EMUL
INTRAVENOUS | Status: DC | PRN
Start: 2022-01-06 — End: 2022-01-06
  Administered 2022-01-06: 145 ug/kg/min via INTRAVENOUS

## 2022-01-06 MED ORDER — PROPOFOL 10 MG/ML IV BOLUS
INTRAVENOUS | Status: DC | PRN
  Administered 2022-01-06: 10 mg via INTRAVENOUS
  Administered 2022-01-06: 60 mg via INTRAVENOUS

## 2022-01-06 NOTE — H&P (Signed)
Outpatient short stay form Pre-procedure 01/06/2022 2:32 PM Jernee Murtaugh K. Norma Fredrickson, M.D.  Primary Physician: Camillo Flaming, PA  Reason for visit:  Follow up peptic ulcer disease  History of present illness:  Ms. Shampine presents to the [facility] clinic for chief complaint of peptic ulcer disease and iron-deficiency anemia. She was seen in the hospital at [facility] during admission in November 2022 for acute blood loss anemia/symptomatic anemia where she presented with 2-day history of melanotic stool and hemoglobin down to 7.0 from baseline of >12. Dr. Norma Fredrickson saw patient in the hospital as consult and she underwent bidirectional endoscopy. EGD performed 09/30/21 by Dr. Norma Fredrickson showed normal esophagus, large hiatal hernia, multiple dispersed erosions with no bleeding in cardia, fundus, and antrum, three non-bleeding superficial gastric ulcers with no stigmata of bleeding in prepyloric region of the stomach, one non-bleeding cratered duodenal ulcer with no stigmata of bleeding found in duodenal bulb, and benign-appearing stenosis in first portion of duodenum wh ich was able to be traversed. Colonoscopy was completely normal.    Current Facility-Administered Medications:    0.9 %  sodium chloride infusion, , Intravenous, Continuous, Demontay Grantham, Boykin Nearing, MD, Last Rate: 20 mL/hr at 01/06/22 1416, Continued from Pre-op at 01/06/22 1416  Medications Prior to Admission  Medication Sig Dispense Refill Last Dose   calcium-vitamin D (OSCAL WITH D) 500-5 MG-MCG tablet Take 1 tablet by mouth.   01/02/2022   citalopram (CELEXA) 40 MG tablet Take 40 mg by mouth daily.   01/05/2022   ferrous sulfate 325 (65 FE) MG tablet Take 325 mg by mouth daily with breakfast.   01/02/2022   pantoprazole (PROTONIX) 40 MG tablet Take 40 mg by mouth daily.   01/05/2022   propranolol ER (INDERAL LA) 160 MG SR capsule Take 160 mg by mouth daily.   01/06/2022 at 0700   rOPINIRole (REQUIP) 2 MG tablet Take 1 tablet (2 mg total) by mouth  at bedtime. 14 tablet 0 01/05/2022   SUMAtriptan (IMITREX) 100 MG tablet Take 100 mg by mouth every 2 (two) hours as needed for migraine. May repeat in 2 hours if headache persists or recurs.      telmisartan (MICARDIS) 20 MG tablet Take 20 mg by mouth daily.   01/05/2022   traZODone (DESYREL) 50 MG tablet Take 75 mg by mouth at bedtime.   01/05/2022     Allergies  Allergen Reactions   Percocet [Oxycodone-Acetaminophen] Nausea Only     Past Medical History:  Diagnosis Date   Anemia    Anxiety    Arthritis    GERD (gastroesophageal reflux disease)    Hypertension    Takes propranolol for HTN Does not see a cardiologist   Migraine headache    Restless leg syndrome    Vitamin D deficiency     Review of systems:  Otherwise negative.    Physical Exam  Gen: Alert, oriented. Appears stated age.  HEENT: [place]/AT. PERRLA. Lungs: CTA, no wheezes. CV: RR nl S1, S2. Abd: soft, benign, no masses. BS+ Ext: No edema. Pulses 2+    Planned procedures: Proceed with EGD. The patient understands the nature of the planned procedure, indications, risks, alternatives and potential complications including but not limited to bleeding, infection, perforation, damage to internal organs and possible oversedation/side effects from anesthesia. The patient agrees and gives consent to proceed.  Please refer to procedure notes for findings, recommendations and patient disposition/instructions.     Brannan Cassedy K. Norma Fredrickson, M.D. Gastroenterology 01/06/2022  2:32 PM

## 2022-01-06 NOTE — Anesthesia Procedure Notes (Signed)
Procedure Name: General with mask airway Date/Time: 01/06/2022 2:48 PM Performed by: Kelton Pillar, CRNA Pre-anesthesia Checklist: Patient identified, Emergency Drugs available, Suction available and Patient being monitored Patient Re-evaluated:Patient Re-evaluated prior to induction Oxygen Delivery Method: Simple face mask Induction Type: IV induction Placement Confirmation: positive ETCO2 and CO2 detector Dental Injury: Teeth and Oropharynx as per pre-operative assessment

## 2022-01-06 NOTE — Interval H&P Note (Signed)
History and Physical Interval Note:  01/06/2022 2:34 PM  Amanda Brooks  has presented today for surgery, with the diagnosis of PUD (peptic ulcer disease) (K27.9).  The various methods of treatment have been discussed with the patient and family. After consideration of risks, benefits and other options for treatment, the patient has consented to  Procedure(s): ESOPHAGOGASTRODUODENOSCOPY (EGD) (N/A) as a surgical intervention.  The patient's history has been reviewed, patient examined, no change in status, stable for surgery.  I have reviewed the patient's chart and labs.  Questions were answered to the patient's satisfaction.     [place], [place]

## 2022-01-06 NOTE — Anesthesia Preprocedure Evaluation (Signed)
Anesthesia Evaluation  Patient identified by MRN, date of birth, ID band Patient awake    Reviewed: Allergy & Precautions, NPO status , Patient's Chart, lab work & pertinent test results, reviewed documented beta blocker date and time   Airway Mallampati: III  TM Distance: <3 FB Neck ROM: full    Dental no notable dental hx.    Pulmonary neg pulmonary ROS,    Pulmonary exam normal        Cardiovascular hypertension, Pt. on medications and Pt. on home beta blockers (-) angina(-) Past MI and (-) DOE Normal cardiovascular exam Rhythm:Regular     Neuro/Psych  Headaches, PSYCHIATRIC DISORDERS Anxiety    GI/Hepatic Neg liver ROS, Bowel prep,GERD  Medicated and Controlled,  Endo/Other  negative endocrine ROS  Renal/GU negative Renal ROS  negative genitourinary   Musculoskeletal  (+) Arthritis ,   Abdominal Normal abdominal exam  (+)   Peds  Hematology negative hematology ROS (+)   Anesthesia Other Findings Past Medical History: No date: Anxiety No date: Arthritis No date: GERD (gastroesophageal reflux disease) No date: Hypertension     Comment:  Takes propranolol for HTN Does not see a cardiologist No date: Migraine headache No date: Restless leg syndrome No date: Vitamin D deficiency  Past Surgical History: No date: APPENDECTOMY     Comment:  done as part of tubal ligation No date: TUBAL LIGATION  BMI    Body Mass Index: 24.20 kg/m      Reproductive/Obstetrics negative OB ROS                             Anesthesia Physical  Anesthesia Plan  ASA: 2  Anesthesia Plan: General   Post-op Pain Management:    Induction: Intravenous  PONV Risk Score and Plan: Propofol infusion and TIVA  Airway Management Planned: Natural Airway and Nasal Cannula  Additional Equipment:   Intra-op Plan:   Post-operative Plan:   Informed Consent: I have reviewed the patients History and  Physical, chart, labs and discussed the procedure including the risks, benefits and alternatives for the proposed anesthesia with the patient or authorized representative who has indicated his/her understanding and acceptance.     Dental Advisory Given  Plan Discussed with: Anesthesiologist, CRNA and Surgeon  Anesthesia Plan Comments: (Patient consented for risks of anesthesia including but not limited to:  - adverse reactions to medications - risk of airway placement if required - damage to eyes, teeth, lips or other oral mucosa - nerve damage due to positioning  - sore throat or hoarseness - Damage to heart, brain, nerves, lungs, other parts of body or loss of life  Patient voiced understanding.)        Anesthesia Quick Evaluation

## 2022-01-06 NOTE — Op Note (Signed)
[facility] Gastroenterology Patient Name: Amanda Brooks Procedure Date: 01/06/2022 2:32 PM MRN: [id number] Account #: [id number] Date of Birth: June 11, 1951 Admit Type: Outpatient Age: 71 Room: [facility] ENDO ROOM 2 Gender: Female Note Status: Finalized Instrument Name: Upper Endoscope [id number] Procedure:             Upper GI endoscopy Indications:           Follow-up of gastric ulcer with hemorrhage, Follow-up                         of duodenal ulcer with hemorrhage Providers:             Benay Pike. Ranbir Chew MD, MD Medicines:             Propofol per Anesthesia Complications:         No immediate complications. Estimated blood loss: None. Procedure:             Pre-Anesthesia Assessment:                        - The risks and benefits of the procedure and the                         sedation options and risks were discussed with the                         patient. All questions were answered and informed                         consent was obtained.                        - Patient identification and proposed procedure were                         verified prior to the procedure by the nurse. The                         procedure was verified in the procedure room.                        - ASA Grade Assessment: III - A patient with severe                         systemic disease.                        - After reviewing the risks and benefits, the patient                         was deemed in satisfactory condition to undergo the                         procedure.                        After obtaining informed consent, the endoscope was                         passed under direct vision. Throughout the procedure,  the patient's blood pressure, pulse, and oxygen                         saturations were monitored continuously. The Endoscope                         was introduced through the mouth, and advanced to the                         third  part of duodenum. The upper GI endoscopy was                         accomplished without difficulty. The patient tolerated                         the procedure well. Findings:      The esophagus was normal.      A 5 cm hiatal hernia was present.      Patchy mild inflammation characterized by erythema was found in the       gastric antrum.      No gastric/duodenal ulcers noted which is consistent with interval       healing when compared to procedure dated 10/01/2021.      A moderate post-ulcer deformity was found in the duodenal bulb.      The second portion of the duodenum and third portion of the duodenum       were normal. Impression:            - Normal esophagus.                        - 5 cm hiatal hernia.                        - Gastritis.                        - Duodenal deformity.                        - Normal second portion of the duodenum and third                         portion of the duodenum.                        - No specimens collected. Recommendation:        - Patient has a contact number available for                         emergencies. The signs and symptoms of potential                         delayed complications were discussed with the patient.                         Return to normal activities tomorrow. Written                         discharge instructions were provided to the patient.                        -  Resume previous diet.                        - Continue present medications.                        - No aspirin, ibuprofen, naproxen, or other                         non-steroidal anti-inflammatory drugs.                        - Return to GI office PRN.                        - Resume Eliquis (apixaban) at prior dose today. Refer                         to managing physician for further adjustment of                         therapy.                        - The findings and recommendations were discussed with                         the  patient. Procedure Code(s):     --- Professional ---                        [phone], Esophagogastroduodenoscopy, flexible,                         transoral; diagnostic, including collection of                         specimen(s) by brushing or washing, when performed                         (separate procedure) Diagnosis Code(s):     --- Professional ---                        K26.4, Chronic or unspecified duodenal ulcer with                         hemorrhage                        K25.4, Chronic or unspecified gastric ulcer with                         hemorrhage                        K31.89, Other diseases of stomach and duodenum                        K29.70, Gastritis, unspecified, without bleeding                        K44.9, Diaphragmatic hernia without obstruction or  gangrene CPT copyright 2019 American Medical Association. All rights reserved. The codes documented in this report are preliminary and upon coder review may  be revised to meet current compliance requirements. Efrain Sella MD, MD 01/06/2022 2:57:28 PM This report has been signed electronically. Number of Addenda: 0 Note Initiated On: 01/06/2022 2:32 PM Estimated Blood Loss:  Estimated blood loss: none.      [facility]

## 2022-01-06 NOTE — Transfer of Care (Signed)
Immediate Anesthesia Transfer of Care Note  Patient: Amanda Brooks  Procedure(s) Performed: ESOPHAGOGASTRODUODENOSCOPY (EGD)  Patient Location: Endoscopy Unit  Anesthesia Type:General  Level of Consciousness: awake, drowsy and patient cooperative  Airway & Oxygen Therapy: Patient Spontanous Breathing and Patient connected to face mask oxygen  Post-op Assessment: Report given to RN and Post -op Vital signs reviewed and stable  Post vital signs: Reviewed and stable  Last Vitals:  Vitals Value Taken Time  BP 138/90 01/06/22 1453  Temp    Pulse 72 01/06/22 1453  Resp 18 01/06/22 1453  SpO2 100 % 01/06/22 1453  Vitals shown include unvalidated device data.  Last Pain:  Vitals:   01/06/22 1327  TempSrc: Temporal  PainSc: 0-No pain         Complications: No notable events documented.

## 2022-01-06 NOTE — Anesthesia Postprocedure Evaluation (Signed)
Anesthesia Post Note  Patient: Amanda Brooks  Procedure(s) Performed: ESOPHAGOGASTRODUODENOSCOPY (EGD)  Patient location during evaluation: Endoscopy Anesthesia Type: General Level of consciousness: awake and alert Pain management: pain level controlled Vital Signs Assessment: post-procedure vital signs reviewed and stable Respiratory status: spontaneous breathing, nonlabored ventilation, respiratory function stable and patient connected to nasal cannula oxygen Cardiovascular status: blood pressure returned to baseline and stable Postop Assessment: no apparent nausea or vomiting Anesthetic complications: no   No notable events documented.   Last Vitals:  Vitals:   01/06/22 1327 01/06/22 1455  BP: (!) 163/84   Pulse: 71   Resp: 16   Temp: (!) 36 C 36.8 C  SpO2: 100%     Last Pain:  Vitals:   01/06/22 1505  TempSrc:   PainSc: 0-No pain                 Cleda Mccreedy [place]

## 2022-01-07 ENCOUNTER — Encounter: Payer: Self-pay | Admitting: Internal Medicine

## 2022-09-26 ENCOUNTER — Ambulatory Visit
Admission: EM | Admit: 2022-09-26 | Discharge: 2022-09-26 | Disposition: A | Discharge status: Home / Self Care | Payer: Medicare Other | Attending: Family Medicine | Admitting: Family Medicine

## 2022-09-26 ENCOUNTER — Encounter: Payer: Self-pay | Admitting: Emergency Medicine

## 2022-09-26 DIAGNOSIS — J069 Acute upper respiratory infection, unspecified: Secondary | ICD-10-CM | POA: Insufficient documentation

## 2022-09-26 DIAGNOSIS — Z1152 Encounter for screening for COVID-19: Secondary | ICD-10-CM | POA: Diagnosis not present

## 2022-09-26 LAB — RESP PANEL BY RT-PCR (FLU A&B, COVID) ARPGX2
Influenza A by PCR: NEGATIVE
Influenza B by PCR: NEGATIVE
SARS Coronavirus 2 by RT PCR: NEGATIVE

## 2022-09-26 LAB — GROUP A STREP BY PCR: Group A Strep by PCR: NOT DETECTED

## 2022-09-26 MED ORDER — PROMETHAZINE-DM 6.25-15 MG/5ML PO SYRP: 5.0000 mL | ORAL_SOLUTION | Freq: Four times a day (QID) | ORAL | 0 refills | Status: DC | PRN

## 2022-09-26 MED ORDER — IPRATROPIUM BROMIDE 0.06 % NA SOLN: 2.0000 | Freq: Four times a day (QID) | NASAL | 0 refills | Status: AC | PRN

## 2022-09-26 NOTE — ED Provider Notes (Signed)
[facility]    CSN: [id number] Arrival date & time: 09/26/22  0856      History   Chief Complaint Chief Complaint  Patient presents with   Cough   Sore Throat   HPI  71 year old female presents for evaluation of the above.  Patient reports that her symptoms started on Friday.  She has had cough which has been worse at night.  She also reports severe sore throat.  No documented fever at home.  Highest temperature has been 99.3.  Denies any sick contacts.  No relieving factors.  No other associated symptoms.  No other complaint.  Past Medical History:  Diagnosis Date   Anemia    Anxiety    Arthritis    GERD (gastroesophageal reflux disease)    Hypertension    Takes propranolol for HTN Does not see a cardiologist   Migraine headache    Restless leg syndrome    Vitamin D deficiency     Patient Active Problem List   Diagnosis Date Noted   Acute GI bleeding 09/30/2021   Restless leg syndrome 04/02/2017   Encounter for medication refill 04/02/2017    Past Surgical History:  Procedure Laterality Date   APPENDECTOMY     done as part of tubal ligation   COLONOSCOPY N/A 10/01/2021   Procedure: COLONOSCOPY;  Surgeon: Toledo, Boykin Nearing, MD;  Location: [facility] ENDOSCOPY;  Service: Gastroenterology;  Laterality: N/A;   ESOPHAGOGASTRODUODENOSCOPY N/A 09/30/2021   Procedure: ESOPHAGOGASTRODUODENOSCOPY (EGD);  Surgeon: Toledo, Boykin Nearing, MD;  Location: [facility] ENDOSCOPY;  Service: Gastroenterology;  Laterality: N/A;   ESOPHAGOGASTRODUODENOSCOPY N/A 01/06/2022   Procedure: ESOPHAGOGASTRODUODENOSCOPY (EGD);  Surgeon: Toledo, Boykin Nearing, MD;  Location: [facility] ENDOSCOPY;  Service: Gastroenterology;  Laterality: N/A;   TUBAL LIGATION      OB History   No obstetric history on file.      Home Medications    Prior to Admission medications   Medication Sig Start Date End Date Taking? Authorizing Provider  calcium-vitamin D (OSCAL WITH D) 500-5 MG-MCG tablet Take 1 tablet by  mouth.   Yes [provider]  ipratropium (ATROVENT) 0.06 % nasal spray Place 2 sprays into both nostrils 4 (four) times daily as needed for rhinitis. 09/26/22  Yes Lawarence Meek G, DO  olmesartan (BENICAR) 40 MG tablet Take by mouth. 12/22/21  Yes [provider]  pantoprazole (PROTONIX) 40 MG tablet Take 40 mg by mouth daily. 09/23/21  Yes [provider]  promethazine-dextromethorphan (PROMETHAZINE-DM) 6.25-15 MG/5ML syrup Take 5 mLs by mouth 4 (four) times daily as needed for cough. 09/26/22  Yes Argil Mahl G, DO  propranolol ER (INDERAL LA) 160 MG SR capsule Take 160 mg by mouth daily.   Yes [provider]  rOPINIRole (REQUIP) 2 MG tablet Take 1 tablet (2 mg total) by mouth at bedtime. 04/02/17  Yes Defelice, Para March, NP  traZODone (DESYREL) 50 MG tablet Take 75 mg by mouth at bedtime. 09/28/21  Yes [provider]  citalopram (CELEXA) 40 MG tablet Take 40 mg by mouth daily.    [provider]  ferrous sulfate 325 (65 FE) MG tablet Take 325 mg by mouth daily with breakfast.    [provider]  SUMAtriptan (IMITREX) 100 MG tablet Take 100 mg by mouth every 2 (two) hours as needed for migraine. May repeat in 2 hours if headache persists or recurs.    [provider]  telmisartan (MICARDIS) 20 MG tablet Take 20 mg by mouth daily.    [provider]    Social History Social History   Tobacco Use   Smoking status: Never   Smokeless tobacco: Never  Vaping Use   Vaping Use: Never used  Substance Use Topics   Alcohol use: No   Drug use: No     Allergies   Percocet [oxycodone-acetaminophen]   Review of Systems Review of Systems  Respiratory:  Positive for cough.    Per HPI  Physical Exam Triage Vital Signs ED Triage Vitals  Enc Vitals Group     BP 09/26/22 0910 127/73     Pulse Rate 09/26/22 0910 74     Resp 09/26/22 0910 16     Temp 09/26/22 0910 98.6 F (37 C)     Temp Source 09/26/22 0910 Oral      SpO2 09/26/22 0910 96 %     Weight 09/26/22 0908 154 lb 15.7 oz (70.3 kg)     Height 09/26/22 0908 5\' 5"  (1.651 m)     Head Circumference --      Peak Flow --      Pain Score 09/26/22 0907 8     Pain Loc --      Pain Edu? --      Excl. in GC? --    Updated Vital Signs BP 127/73 (BP Location: Left Arm)   Pulse 74   Temp 98.6 F (37 C) (Oral)   Resp 16   Ht 5\' 5"  (1.651 m)   Wt 70.3 kg   SpO2 96%   BMI 25.79 kg/m   Visual Acuity Right Eye Distance:   Left Eye Distance:   Bilateral Distance:    Right Eye Near:   Left Eye Near:    Bilateral Near:     Physical Exam Vitals and nursing note reviewed.  Constitutional:      General: She is not in acute distress.    Appearance: Normal appearance.  HENT:     Head: Normocephalic and atraumatic.     Right Ear: Tympanic membrane normal.     Left Ear: Tympanic membrane normal.     Mouth/Throat:     Pharynx: Posterior oropharyngeal erythema present. No oropharyngeal exudate.  Eyes:     General:        Right eye: No discharge.        Left eye: No discharge.  Cardiovascular:     Rate and Rhythm: Normal rate and regular rhythm.  Pulmonary:     Effort: Pulmonary effort is normal.     Breath sounds: Normal breath sounds. No wheezing, rhonchi or rales.  Neurological:     Mental Status: She is alert.  Psychiatric:        Mood and Affect: Mood normal.        Behavior: Behavior normal.    UC Treatments / Results  Labs (all labs ordered are listed, but only abnormal results are displayed) Labs Reviewed  GROUP A STREP BY PCR  RESP PANEL BY RT-PCR (FLU A&B, COVID) [id number]    EKG   Radiology No results found.  Procedures Procedures (including critical care time)  Medications Ordered in UC Medications - No data to display  Initial Impression / Assessment and Plan / UC Course  I have reviewed the triage vital signs and the nursing notes.  Pertinent labs & imaging results that were available during my care of the  patient were reviewed by me and considered in my medical decision making (see chart for details).    71 year old female presents with  a viral URI with cough.  Strep negative.  COVID-negative.  Flu and RSV negative.  Atrovent nasal spray and Promethazine DM as prescribed.  Supportive care.  Final Clinical Impressions(s) / UC Diagnoses   Final diagnoses:  Viral URI with cough     Discharge Instructions      Testing was negative. No evidence of strep, COVID, RSV, flu.  This is viral.  Lots of fluids and rest.  Medications as prescribed.     ED Prescriptions     Medication Sig Dispense Auth. Provider   ipratropium (ATROVENT) 0.06 % nasal spray Place 2 sprays into both nostrils 4 (four) times daily as needed for rhinitis. 15 mL Tyronne Blann G, DO   promethazine-dextromethorphan (PROMETHAZINE-DM) 6.25-15 MG/5ML syrup Take 5 mLs by mouth 4 (four) times daily as needed for cough. 118 mL Tommie Sams, DO      PDMP not reviewed this encounter.   Tommie Sams, [place] 09/26/22 [id number]

## 2022-09-26 NOTE — ED Triage Notes (Signed)
Pt c/o cough, sore throat, post nasal drip. Started about 3 days ago. She states she had a "fever" of 99.3 last night.

## 2022-09-26 NOTE — Discharge Instructions (Signed)
Testing was negative. No evidence of strep, COVID, RSV, flu.  This is viral.  Lots of fluids and rest.  Medications as prescribed.

## 2022-10-26 ENCOUNTER — Ambulatory Visit (INDEPENDENT_AMBULATORY_CARE_PROVIDER_SITE_OTHER): Payer: Medicare Other

## 2022-10-26 ENCOUNTER — Ambulatory Visit: Payer: Medicare Other

## 2022-10-26 ENCOUNTER — Ambulatory Visit
Admission: EM | Admit: 2022-10-26 | Discharge: 2022-10-26 | Disposition: A | Discharge status: Home / Self Care | Payer: Medicare Other | Attending: Emergency Medicine | Admitting: Emergency Medicine

## 2022-10-26 DIAGNOSIS — S20211A Contusion of right front wall of thorax, initial encounter: Secondary | ICD-10-CM | POA: Diagnosis not present

## 2022-10-26 DIAGNOSIS — J101 Influenza due to other identified influenza virus with other respiratory manifestations: Secondary | ICD-10-CM | POA: Diagnosis present

## 2022-10-26 DIAGNOSIS — W19XXXA Unspecified fall, initial encounter: Secondary | ICD-10-CM

## 2022-10-26 DIAGNOSIS — Z1152 Encounter for screening for COVID-19: Secondary | ICD-10-CM | POA: Diagnosis not present

## 2022-10-26 DIAGNOSIS — X58XXXA Exposure to other specified factors, initial encounter: Secondary | ICD-10-CM | POA: Insufficient documentation

## 2022-10-26 DIAGNOSIS — R0782 Intercostal pain: Secondary | ICD-10-CM

## 2022-10-26 LAB — RESP PANEL BY RT-PCR (RSV, FLU A&B, COVID)  RVPGX2
Influenza A by PCR: POSITIVE — AB
Influenza B by PCR: NEGATIVE
Resp Syncytial Virus by PCR: NEGATIVE
SARS Coronavirus 2 by RT PCR: NEGATIVE

## 2022-10-26 MED ORDER — AEROCHAMBER MV MISC: 1 refills | Status: AC

## 2022-10-26 MED ORDER — ALBUTEROL SULFATE HFA 108 (90 BASE) MCG/ACT IN AERS: 1.0000 | INHALATION_SPRAY | RESPIRATORY_TRACT | 0 refills | Status: AC | PRN

## 2022-10-26 MED ORDER — OSELTAMIVIR PHOSPHATE 75 MG PO CAPS: 75.0000 mg | ORAL_CAPSULE | Freq: Two times a day (BID) | ORAL | 0 refills | Status: AC

## 2022-10-26 MED ORDER — HYDROCOD POLI-CHLORPHE POLI ER 10-8 MG/5ML PO SUER: 5.0000 mL | Freq: Two times a day (BID) | ORAL | 0 refills | Status: AC | PRN

## 2022-10-26 NOTE — ED Triage Notes (Signed)
Pt c/o cough, body chills, sore throat, congestion x38month  Pt states she was here 1 month ago for a URI  Pt fell in a parking lot 1 week ago and landed over an asphalt curb. Pt is having right side flank pain.

## 2022-10-26 NOTE — Discharge Instructions (Signed)
Your influenza a test came back positive.  Finish the Tamiflu, even if you feel better.  May take at 1000 mg of Tylenol 3 times a day as needed for pain.  Tussionex will help with the cough.  2 puffs from your albuterol inhaler using your spacer every 4 hours for 2 days, then every 6 hours for 2 days, then as needed.  Your x-rays are negative for rib fracture, pneumonia.  Follow-up with your primary care provider if you are not getting any better in about 5 days, go to the ER if you get worse

## 2022-10-26 NOTE — ED Provider Notes (Signed)
HPI  SUBJECTIVE:  Amanda Brooks is a 71 y.o. female who presents with 2 issues:  First, she reports 2 days of fevers Tmax 101.2, body aches, headaches, nasal congestion, clear rhinorrhea, sinus pressure, postnasal drip, dry cough, shortness of breath with coughing and dyspnea on exertion.  She states that she is unable to sleep at night because of the cough.  No sinus pain, facial swelling, upper dental pain, wheezing.  No known COVID, flu, RSV exposure.  She got 4 doses of the COVID-vaccine.  She did not get the flu or RSV vaccine.  No antibiotics in the past 3 months.  No antipyretic in the past 6 hours.  She has been taking Tylenol and elderberry without improvement in her symptoms.  Symptoms are worse with coughing.  Patient was seen here on 11/12 with cough, sore throat.  Thought to have a viral URI with cough, sent home with Atrovent nasal spray and Promethazine DM.  She states that most of her symptoms resolved, but the cough never went away.  Second, she reports a trip and fall onto the corner of an asphalt curb, 1 week ago landing on her right rib cage.  She describes the pain as stabbing soreness present primarily with cough.  No bruising, hemoptysis, hematuria, shortness of breath.  She states that she did not hit her head.  Denies loss of consciousness.  Patient has a past medical history of GI bleed, ulcer, deficiency anemia, hypertension on olmesartan only.  No history of diabetes, pulmonary disease, chronic kidney disease.   Past Medical History:  Diagnosis Date   Anemia    Anxiety    Arthritis    GERD (gastroesophageal reflux disease)    Hypertension    Takes propranolol for HTN Does not see a cardiologist   Migraine headache    Restless leg syndrome    Vitamin D deficiency     Past Surgical History:  Procedure Laterality Date   APPENDECTOMY     done as part of tubal ligation   COLONOSCOPY N/A 10/01/2021   Procedure: COLONOSCOPY;  Surgeon: Toledo, Benay Pike, MD;   Location: [facility];  Service: Gastroenterology;  Laterality: N/A;   ESOPHAGOGASTRODUODENOSCOPY N/A 09/30/2021   Procedure: ESOPHAGOGASTRODUODENOSCOPY (EGD);  Surgeon: Toledo, Benay Pike, MD;  Location: [facility] END[facility];  Service: Gastroenterology;  Laterality: N/A;   ESOPHAGOGASTRODUODENOSCOPY N/A 01/06/2022   Procedure: ESOPHAGOGASTRODUODENOSCOPY (EGD);  Surgeon: Toledo, Benay Pike, MD;  Location: [facility] END[facility];  Service: Gastroenterology;  Laterality: N/A;   TUBAL LIGATION      History reviewed. No pertinent family history.  Social History   Tobacco Use   Smoking status: Never   Smokeless tobacco: Never  Vaping Use   Vaping Use: Never used  Substance Use Topics   Alcohol use: No   Drug use: No    No current facility-administered medications for this encounter.  Current Outpatient Medications:    albuterol (VENTOLIN HFA) 108 (90 Base) MCG/ACT inhaler, Inhale 1-2 puffs into the lungs every 4 (four) hours as needed for wheezing or shortness of breath., Disp: 1 each, Rfl: 0   calcium-vitamin D (OSCAL WITH D) 500-5 MG-MCG tablet, Take 1 tablet by mouth., Disp: , Rfl:    chlorpheniramine-HYDROcodone (TUSSIONEX) 10-8 MG/5ML, Take 5 mLs by mouth every 12 (twelve) hours as needed for cough., Disp: 60 mL, Rfl: 0   citalopram (CELEXA) 40 MG tablet, Take 40 mg by mouth daily., Disp: , Rfl:    ferrous sulfate 325 (65 FE) MG tablet, Take 325 mg by  mouth daily with breakfast., Disp: , Rfl:    ipratropium (ATROVENT) 0.06 % nasal spray, Place 2 sprays into both nostrils 4 (four) times daily as needed for rhinitis., Disp: 15 mL, Rfl: 0   olmesartan (BENICAR) 40 MG tablet, Take by mouth., Disp: , Rfl:    oseltamivir (TAMIFLU) 75 MG capsule, Take 1 capsule (75 mg total) by mouth 2 (two) times daily. X 5 days, Disp: 10 capsule, Rfl: 0   pantoprazole (PROTONIX) 40 MG tablet, Take 40 mg by mouth daily., Disp: , Rfl:    propranolol ER (INDERAL LA) 160 MG SR capsule, Take 160 mg by mouth daily., Disp: ,  Rfl:    rOPINIRole (REQUIP) 2 MG tablet, Take 1 tablet (2 mg total) by mouth at bedtime., Disp: 14 tablet, Rfl: 0   Spacer/Aero-Holding Chambers (AEROCHAMBER MV) inhaler, Use as instructed, Disp: 1 each, Rfl: 1   SUMAtriptan (IMITREX) 100 MG tablet, Take 100 mg by mouth every 2 (two) hours as needed for migraine. May repeat in 2 hours if headache persists or recurs., Disp: , Rfl:    telmisartan (MICARDIS) 20 MG tablet, Take 20 mg by mouth daily., Disp: , Rfl:   Allergies  Allergen Reactions   Percocet [Oxycodone-Acetaminophen] Nausea Only     ROS  As noted in HPI.   Physical Exam  BP (!) 155/84 (BP Location: Left Arm)   Pulse 88   Temp 99.5 F (37.5 C) (Oral)   Resp 16   Ht 5\' 4"  (1.626 m)   Wt 65.8 kg   SpO2 94%   BMI 24.89 kg/m   Constitutional: Well developed, well nourished, no acute distress.  Coughing. Eyes:  EOMI, conjunctiva normal bilaterally HENT: Normocephalic, atraumatic,mucus membranes moist.  Bloody nasal drainage.  Normal turbinates.  Positive maxillary sinus tenderness.  Normal oropharynx.  Tonsils normal size without exudates.  Positive cobblestoning. Neck: No cervical lymphadenopathy Respiratory: Limited inspiratory effort.  No appreciable wheezing, rales, rhonchi.  No T-spine tenderness.  Positive tenderness mid and posterior lower ribs extending anteriorly.  No bruising, appreciable swelling. Cardiovascular: Normal rate, regular rhythm, no murmurs rubs or gallops GI: nondistended skin: No rash, skin intact Musculoskeletal: no deformities Neurologic: Alert & oriented x 3, no focal neuro deficits Psychiatric: Speech and behavior appropriate   ED Course   Medications - No data to display  Orders Placed This Encounter  Procedures   Resp panel by RT-PCR (RSV, Flu A&B, Covid) Anterior Nasal Swab    Standing Status:   Standing    Number of Occurrences:   1   DG Chest 2 View    Standing Status:   Standing    Number of Occurrences:   1    Order  Specific Question:   Reason for Exam (SYMPTOM  OR DIAGNOSIS REQUIRED)    Answer:   cough 1 month new fever r/o PNA   DG Ribs Unilateral Right    Fall 1 week ago, diffuse lower rib tenderness.  Rule out fracture, effusion, pneumothorax.    Standing Status:   Standing    Number of Occurrences:   1    Order Specific Question:   Symptom/Reason for Exam    Answer:   Rib pain on right side [id number]    Results for orders placed or performed during the hospital encounter of 10/26/22 (from the past 24 hour(s))  Resp panel by RT-PCR (RSV, Flu A&B, Covid) Anterior Nasal Swab     Status: Abnormal   Collection Time: 10/26/22  5:19 PM  Specimen: Anterior Nasal Swab  Result Value Ref Range   SARS Coronavirus 2 by RT PCR NEGATIVE NEGATIVE   Influenza A by PCR POSITIVE (A) NEGATIVE   Influenza B by PCR NEGATIVE NEGATIVE   Resp Syncytial Virus by PCR NEGATIVE NEGATIVE   DG Chest 2 View  Result Date: 10/26/2022 CLINICAL DATA:  Fall 1 week ago.  Right rib pain EXAM: CHEST - 2 VIEW COMPARISON:  Chest 02/13/2013 FINDINGS: Heart size and vascularity normal. Lungs clear without infiltrate or effusion. No pneumothorax. Hiatal hernia. Eventration right hemidiaphragm IMPRESSION: No active cardiopulmonary disease. Electronically Signed   By: Marlan Palau M.D.   On: 10/26/2022 17:53   DG Ribs Unilateral Right  Result Date: 10/26/2022 CLINICAL DATA:  Fall 1 week ago.  Right rib pain EXAM: RIGHT RIBS - 2 VIEW COMPARISON:  None Available. FINDINGS: No fracture or other bone lesions are seen involving the ribs. No pleural effusion. IMPRESSION: Negative. Electronically Signed   By: Marlan Palau M.D.   On: 10/26/2022 17:52    ED Clinical Impression  1. Influenza A   2. Contusion of rib on right side, initial encounter      ED Assessment/Plan     Previous records reviewed.  As noted in HPI.  Patient has no past medical history of chronic kidney disease.  Creatinine clearance from November 2022 85  mL/min.  1.  Cough, fever.  Concern for new infection.  Checking COVID, flu, RSV, chest x-ray PA and lateral.  [facility] narcotic database reviewed.  No opiate prescriptions in 2 years.  Patient influenza A positive.  Home with Tamiflu.  Chest x-ray negative for pneumonia, rib series negative for pneumothorax, effusion or fracture. Will send home with Tussionex for the cough.  Patient states that Tessalon and Promethazine DM do nothing for her.  Albuterol inhaler with a spacer scheduled for 4 days, then as needed.  Follow-up with PCP if not improving. ER return precautions given.  2.  Fall with right rib pain, tenderness.  Checking right rib series.    Reviewed imaging independently.  No pneumonia, effusion, pneumothorax, rib fracture.  See radiology report for full details.  Tylenol, Tussionex for cough and pain relief.  Discussed labs, imaging, MDM, treatment plan, and plan for follow-up with patient. Discussed sn/sx that should prompt return to the ED. patient agrees with plan.   Meds ordered this encounter  Medications   oseltamivir (TAMIFLU) 75 MG capsule    Sig: Take 1 capsule (75 mg total) by mouth 2 (two) times daily. X 5 days    Dispense:  10 capsule    Refill:  0   albuterol (VENTOLIN HFA) 108 (90 Base) MCG/ACT inhaler    Sig: Inhale 1-2 puffs into the lungs every 4 (four) hours as needed for wheezing or shortness of breath.    Dispense:  1 each    Refill:  0   Spacer/Aero-Holding Chambers (AEROCHAMBER MV) inhaler    Sig: Use as instructed    Dispense:  1 each    Refill:  1   chlorpheniramine-HYDROcodone (TUSSIONEX) 10-8 MG/5ML    Sig: Take 5 mLs by mouth every 12 (twelve) hours as needed for cough.    Dispense:  60 mL    Refill:  0      *This clinic note was created using Scientist, clinical (histocompatibility and immunogenetics). Therefore, there may be occasional mistakes despite careful proofreading.  ?    Domenick Gong, MD 10/28/22 [phone]

## 2024-04-27 ENCOUNTER — Ambulatory Visit: Payer: Medicare Other | Admitting: Obstetrics

## 2024-11-26 ENCOUNTER — Encounter: Payer: Self-pay | Admitting: *Deleted
